# Patient Record
Sex: Female | Born: 1960 | Marital: Married | Smoking: Former smoker
Health system: Southern US, Community
[De-identification: ages and names within clinical notes are randomized; demographics above are authoritative.]

## PROBLEM LIST (undated history)

## (undated) DIAGNOSIS — E119 Type 2 diabetes mellitus without complications: Secondary | ICD-10-CM

## (undated) DIAGNOSIS — N189 Chronic kidney disease, unspecified: Secondary | ICD-10-CM

## (undated) DIAGNOSIS — T4145XA Adverse effect of unspecified anesthetic, initial encounter: Secondary | ICD-10-CM

## (undated) DIAGNOSIS — F329 Major depressive disorder, single episode, unspecified: Secondary | ICD-10-CM

## (undated) DIAGNOSIS — N289 Disorder of kidney and ureter, unspecified: Secondary | ICD-10-CM

## (undated) DIAGNOSIS — I10 Essential (primary) hypertension: Secondary | ICD-10-CM

## (undated) DIAGNOSIS — G4730 Sleep apnea, unspecified: Secondary | ICD-10-CM

## (undated) DIAGNOSIS — K219 Gastro-esophageal reflux disease without esophagitis: Secondary | ICD-10-CM

## (undated) DIAGNOSIS — F419 Anxiety disorder, unspecified: Secondary | ICD-10-CM

## (undated) DIAGNOSIS — R0600 Dyspnea, unspecified: Secondary | ICD-10-CM

## (undated) DIAGNOSIS — T8859XA Other complications of anesthesia, initial encounter: Secondary | ICD-10-CM

## (undated) DIAGNOSIS — F32A Depression, unspecified: Secondary | ICD-10-CM

## (undated) HISTORY — PX: APPENDECTOMY: SHX54

## (undated) HISTORY — PX: FOOT SURGERY: SHX648

## (undated) HISTORY — PX: HYSTEROSCOPY WITH D & C: SHX1775

## (undated) HISTORY — PX: CHOLECYSTECTOMY: SHX55

## (undated) HISTORY — PX: HAND SURGERY: SHX662

## (undated) HISTORY — PX: LIVER SURGERY: SHX698

## (undated) HISTORY — PX: TONSILLECTOMY: SUR1361

## (undated) HISTORY — PX: CARDIAC SURGERY: SHX584

## (undated) HISTORY — PX: UPPER GI ENDOSCOPY: SHX6162

## (undated) HISTORY — PX: HYSTEROSCOPY W/D&C: SHX1775

---
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
## 1998-12-12 ENCOUNTER — Other Ambulatory Visit: Admission: RE | Admit: 1998-12-12 | Discharge: 1998-12-12 | Payer: Self-pay | Admitting: Podiatry

## 2004-08-06 ENCOUNTER — Ambulatory Visit: Payer: Self-pay

## 2004-08-14 ENCOUNTER — Ambulatory Visit: Payer: Self-pay | Admitting: Gerontology

## 2004-08-22 ENCOUNTER — Ambulatory Visit: Payer: Self-pay | Admitting: Surgery

## 2004-09-16 ENCOUNTER — Ambulatory Visit: Payer: Self-pay | Admitting: Surgery

## 2005-03-17 ENCOUNTER — Ambulatory Visit: Payer: Self-pay | Admitting: Internal Medicine

## 2006-06-02 ENCOUNTER — Ambulatory Visit: Payer: Self-pay | Admitting: Internal Medicine

## 2007-06-15 ENCOUNTER — Ambulatory Visit: Payer: Self-pay | Admitting: Internal Medicine

## 2008-06-15 ENCOUNTER — Ambulatory Visit: Payer: Self-pay | Admitting: Internal Medicine

## 2009-10-17 ENCOUNTER — Ambulatory Visit: Payer: Self-pay | Admitting: Internal Medicine

## 2010-12-04 ENCOUNTER — Ambulatory Visit: Payer: Self-pay | Admitting: Internal Medicine

## 2012-02-11 ENCOUNTER — Ambulatory Visit: Payer: Self-pay | Admitting: Internal Medicine

## 2013-04-22 ENCOUNTER — Ambulatory Visit: Payer: Self-pay

## 2014-06-07 DIAGNOSIS — G4733 Obstructive sleep apnea (adult) (pediatric): Secondary | ICD-10-CM | POA: Insufficient documentation

## 2014-06-07 DIAGNOSIS — I10 Essential (primary) hypertension: Secondary | ICD-10-CM | POA: Insufficient documentation

## 2014-06-07 DIAGNOSIS — F17200 Nicotine dependence, unspecified, uncomplicated: Secondary | ICD-10-CM | POA: Insufficient documentation

## 2014-06-07 DIAGNOSIS — F329 Major depressive disorder, single episode, unspecified: Secondary | ICD-10-CM | POA: Insufficient documentation

## 2014-06-07 DIAGNOSIS — F32A Depression, unspecified: Secondary | ICD-10-CM | POA: Insufficient documentation

## 2014-06-12 ENCOUNTER — Ambulatory Visit: Payer: Self-pay

## 2014-06-29 ENCOUNTER — Ambulatory Visit: Payer: Self-pay | Admitting: Internal Medicine

## 2014-11-27 ENCOUNTER — Ambulatory Visit: Payer: Self-pay | Admitting: Gastroenterology

## 2015-02-26 LAB — SURGICAL PATHOLOGY

## 2015-04-26 DIAGNOSIS — K219 Gastro-esophageal reflux disease without esophagitis: Secondary | ICD-10-CM | POA: Insufficient documentation

## 2015-04-26 DIAGNOSIS — F418 Other specified anxiety disorders: Secondary | ICD-10-CM | POA: Insufficient documentation

## 2015-07-19 ENCOUNTER — Ambulatory Visit
Admission: RE | Admit: 2015-07-19 | Discharge: 2015-07-19 | Disposition: A | Discharge status: Home / Self Care | Payer: Managed Care, Other (non HMO) | Source: Ambulatory Visit | Attending: Internal Medicine | Admitting: Internal Medicine

## 2015-07-19 ENCOUNTER — Other Ambulatory Visit: Payer: Self-pay | Admitting: Internal Medicine

## 2015-07-19 DIAGNOSIS — M7989 Other specified soft tissue disorders: Secondary | ICD-10-CM | POA: Diagnosis not present

## 2015-07-19 DIAGNOSIS — M79605 Pain in left leg: Secondary | ICD-10-CM

## 2015-10-22 NOTE — H&P (Signed)
GYN PRE-OP NOTE  CC: PMB, endometrial polyp on biopsy  LMP: 5 years ago  Subjective:    Alyssa Maynard is a 54 y.o. female who presents for a retun office visit. She was found to have an endometrial polyp on biopsy after evaluation for post-menopausal bleeding.  Denies dysparunia. Abnl discharge, lesions.   No other complaints.   Gynecologic History No LMP recorded. Patient is postmenopausal. Sexually active: yes Desires STD screening: no  Last Pap: 2013. Results were: normal Last mammogram: 2015. Results were: normal  Obstetric History            OB History  Gravida Para Term Preterm AB SAB TAB Ectopic Multiple Living  0 0 0 0 0 0 0 0 0 0         Past Medical History:  has a past medical history of Acid reflux; Depression; Frequent episodes of bronchitis; Ganglion cyst of wrist; Hyperplastic colon polyp (11/27/2014); Liver tumor (benign); and Tubular adenoma of colon (11/27/2014). Problem List: has HTN (hypertension); OSA (obstructive sleep apnea); GERD (gastroesophageal reflux disease); Tobacco dependence; Depression; Cough; Essential hypertension; Hyperglycemia; Hyperlipidemia; Mixed anxiety and depressive disorder; Gastroesophageal reflux disease without esophagitis; Type 2 diabetes mellitus without complication, without long-term current use of insulin; Pure hypercholesterolemia; and PMB (postmenopausal bleeding) on her problem list. Past Surgical History:  has a past surgical history that includes Tonsillectomy; Appendectomy; Cholecystectomy (09/2004); BIOPSY OF LIVER NODULE (08/2004); Colonoscopy (03/2004); BUNION REMOVAL; and Colonoscopy (11/27/2014). Family History: family history includes Cancer in her father; Clotting disorder in her father; Heart attack in her father, mother, and other; Stroke in her mother. Social History:  reports that she has been smoking Cigarettes. She has been smoking about 0.50 packs per day. She has never used smokeless tobacco. She  reports that she drinks alcohol. She reports that she does not use illicit drugs. Current Medications: has a current medication list which includes the following prescription(s): amlodipine, aspirin, blood glucose diagnostic, blood glucose meter, escitalopram oxalate, esomeprazole, lancets, lisinopril-hydrochlorothiazide, metformin, naproxen, proair hfa, and tramadol. Prior to encounter Medications:        Current Outpatient Prescriptions on File Prior to Visit  Medication Sig Dispense Refill  . amLODIPine (NORVASC) 10 MG tablet Take 10 mg by mouth once daily.  11  . aspirin 81 MG EC tablet Take 81 mg by mouth once daily.    . blood glucose diagnostic (ONETOUCH ULTRA TEST) test strip Use once daily. 100 each 3  . blood glucose meter kit Use as directed. 1 each 0  . escitalopram oxalate (LEXAPRO) 10 MG tablet take 1 tablet by mouth once daily 30 tablet 2  . esomeprazole (NEXIUM) 40 MG DR capsule take 1 capsule by mouth once daily 90 capsule 3  . lancets (ONETOUCH DELICA LANCETS) 33 gauge Misc Use 1 Device once daily. 100 each 3  . lisinopril-hydrochlorothiazide (PRINZIDE,ZESTORETIC) 20-12.5 mg tablet take 2 tablets by mouth once daily 180 tablet 1  . metFORMIN (GLUCOPHAGE) 850 MG tablet Take 1 tablet (850 mg total) by mouth 2 (two) times daily with meals. 180 tablet 3  . naproxen (NAPROSYN) 500 MG tablet Take 500 mg by mouth 2 (two) times daily.    [name] PROAIR HFA 90 mcg/actuation inhaler once daily as needed.  0  . traMADol (ULTRAM) 50 mg tablet Take 50-100 mg by mouth every 8 (eight) hours as needed.     No current facility-administered medications on file prior to visit.    Allergies: is allergic to bandages/flexible/assorted [adhesive bandage] and latex.  Review of Systems 14 systems reviewed pertinent positives and negatives as noted in the HPI and below.   Objective:       Vitals:   10/12/15 0954  BP: 145/74  Pulse: 94   General appearance: alert, appears stated age  and cooperative Head: Normocephalic, without obvious abnormality, atraumatic Eyes: conjunctivae/corneas clear. PERRL, EOM's intact. Fundi benign. Sclera anicteric.  PULM: CTAB CV: RRR  From previous visit: Abdomen: soft, non-tender; bowel sounds normal; no masses, no organomegaly Pelvic: cervix normal in appearance, external genitalia normal, no adnexal masses or tenderness, no bladder tenderness, no cervical motion tenderness, rectovaginal septum normal, urethra without abnormality or discharge, uterus normal size, shape, and consistency and vagina normal without discharge Extremities: extremities normal, atraumatic, no cyanosis or edema Lymph nodes: Inguinal adenopathy: none    EMBx:  Benign endometrial polyp  TVUS: Ut wnl Endometrium= 5.61 mm Abn postmenopause bil ov simple cysts 1 rt simple=1.17 cm 2 rt simple paraovarian=1.29 cm Lt simple=1.17 cm   Assessment:    54yo G0 w/ PMB, endometrial polyp on biopsy. Plan for OR for D&C hysteroscopy, polypectomy.   Plan:     1. PMB, endometrial polyp - R/B/A of D&C hysteroscopy/polypectomy reviewed with patient including risks of bleeding/pain/infection; risks associated with anesthesia also reviewed; procedure described in detail - All questions answered, consent signed  2. HCM - mammogram needs to be scheduled at next visit  3. Simple ovarian cysts noted on exam - will f/u in 6 months given simple appearance, lack of symptoms, and size  Joylene Igo, MD

## 2015-10-23 ENCOUNTER — Encounter
Admission: RE | Admit: 2015-10-23 | Discharge: 2015-10-23 | Disposition: A | Discharge status: Home / Self Care | Payer: Managed Care, Other (non HMO) | Source: Ambulatory Visit | Attending: Obstetrics and Gynecology | Admitting: Obstetrics and Gynecology

## 2015-10-23 DIAGNOSIS — Z01812 Encounter for preprocedural laboratory examination: Secondary | ICD-10-CM | POA: Insufficient documentation

## 2015-10-23 DIAGNOSIS — Z01810 Encounter for preprocedural cardiovascular examination: Secondary | ICD-10-CM | POA: Diagnosis not present

## 2015-10-23 HISTORY — DX: Type 2 diabetes mellitus without complications: E11.9

## 2015-10-23 HISTORY — DX: Anxiety disorder, unspecified: F41.9

## 2015-10-23 HISTORY — DX: Gastro-esophageal reflux disease without esophagitis: K21.9

## 2015-10-23 HISTORY — DX: Essential (primary) hypertension: I10

## 2015-10-23 HISTORY — DX: Depression, unspecified: F32.A

## 2015-10-23 HISTORY — DX: Major depressive disorder, single episode, unspecified: F32.9

## 2015-10-23 LAB — TYPE AND SCREEN
ABO/RH(D): A POS
Antibody Screen: NEGATIVE

## 2015-10-23 LAB — BASIC METABOLIC PANEL WITH GFR
Anion gap: 12 (ref 5–15)
BUN: 19 mg/dL (ref 6–20)
CO2: 20 mmol/L — ABNORMAL LOW (ref 22–32)
Calcium: 9.6 mg/dL (ref 8.9–10.3)
Chloride: 106 mmol/L (ref 101–111)
Creatinine, Ser: 1.12 mg/dL — ABNORMAL HIGH (ref 0.44–1.00)
GFR calc Af Amer: >60 mL/min (ref >60)
GFR calc non Af Amer: 54 mL/min — ABNORMAL LOW (ref >60)
Glucose, Bld: 217 mg/dL — ABNORMAL HIGH (ref 65–99)
Potassium: 3.6 mmol/L (ref 3.5–5.1)
Sodium: 138 mmol/L (ref 135–145)

## 2015-10-23 NOTE — Pre-Procedure Instructions (Signed)
Met B results sent to Dr. Halfon and anesthesia for review. 

## 2015-10-23 NOTE — Patient Instructions (Addendum)
  Your procedure is scheduled on: Friday 11/02/2015 Report to Day Surgery. 2ND FLOOR MEDICAL MALL ENTRANCE To find out your arrival time please call [phone] between 1PM - 3PM on Thursday 11/01/2015.  Remember: Instructions that are not followed completely may result in serious medical risk, up to and including death, or upon the discretion of your surgeon and anesthesiologist your surgery may need to be rescheduled.    __X__ 1. Do not eat food or drink liquids after midnight. No gum chewing or hard candies.     __X__ 2. No Alcohol for 24 hours before or after surgery.   ____ 3. Bring all medications with you on the day of surgery if instructed.    __X__ 4. Notify your doctor if there is any change in your medical condition     (cold, fever, infections).     Do not wear jewelry, make-up, hairpins, clips or nail polish.  Do not wear lotions, powders, or perfumes. You may wear deodorant.  Do not shave 48 hours prior to surgery. Men may shave face and neck.  Do not bring valuables to the hospital.    [facility] is not responsible for any belongings or valuables.               Contacts, dentures or bridgework may not be worn into surgery.  Leave your suitcase in the car. After surgery it may be brought to your room.  For patients admitted to the hospital, discharge time is determined by your                treatment team.   Patients discharged the day of surgery will not be allowed to drive home.   Please read over the following fact sheets that you were given:   Surgical Site Infection Prevention   __X__ Take these medicines the morning of surgery with A SIP OF WATER:    1. LEXAPRO  2. NEXIUM  3.   4.  5.  6.  ____ Fleet Enema (as directed)   ____ Use CHG Soap as directed  ____ Use inhalers on the day of surgery  __X__ Stop metformin 2 days prior to surgery    ____ Take 1/2 of usual insulin dose the night before surgery and none on the morning of surgery.   __X__  Stop Coumadin/Plavix/aspirin on FRIDAY 10/26/2015  ____ Stop Anti-inflammatories on    ____ Stop supplements until after surgery.    __X__ Bring C-Pap to the hospital.

## 2015-10-23 NOTE — Pre-Procedure Instructions (Addendum)
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

## 2015-10-24 LAB — ABO/RH: ABO/RH(D): A POS

## 2015-11-02 ENCOUNTER — Encounter
Admission: RE | Disposition: A | Discharge status: Home / Self Care | Payer: Self-pay | Source: Ambulatory Visit | Attending: Obstetrics and Gynecology

## 2015-11-02 ENCOUNTER — Ambulatory Visit: Payer: Managed Care, Other (non HMO) | Admitting: *Deleted

## 2015-11-02 ENCOUNTER — Ambulatory Visit
Admission: RE | Admit: 2015-11-02 | Discharge: 2015-11-02 | Disposition: A | Discharge status: Home / Self Care | Payer: Managed Care, Other (non HMO) | Source: Ambulatory Visit | Attending: Obstetrics and Gynecology | Admitting: Obstetrics and Gynecology

## 2015-11-02 DIAGNOSIS — E7800 Pure hypercholesterolemia, unspecified: Secondary | ICD-10-CM | POA: Diagnosis not present

## 2015-11-02 DIAGNOSIS — Z79899 Other long term (current) drug therapy: Secondary | ICD-10-CM | POA: Insufficient documentation

## 2015-11-02 DIAGNOSIS — E119 Type 2 diabetes mellitus without complications: Secondary | ICD-10-CM | POA: Insufficient documentation

## 2015-11-02 DIAGNOSIS — K219 Gastro-esophageal reflux disease without esophagitis: Secondary | ICD-10-CM | POA: Insufficient documentation

## 2015-11-02 DIAGNOSIS — F17210 Nicotine dependence, cigarettes, uncomplicated: Secondary | ICD-10-CM | POA: Diagnosis not present

## 2015-11-02 DIAGNOSIS — R05 Cough: Secondary | ICD-10-CM | POA: Diagnosis not present

## 2015-11-02 DIAGNOSIS — F329 Major depressive disorder, single episode, unspecified: Secondary | ICD-10-CM | POA: Diagnosis not present

## 2015-11-02 DIAGNOSIS — N840 Polyp of corpus uteri: Secondary | ICD-10-CM | POA: Diagnosis present

## 2015-11-02 DIAGNOSIS — Z6841 Body Mass Index (BMI) 40.0 and over, adult: Secondary | ICD-10-CM | POA: Diagnosis not present

## 2015-11-02 DIAGNOSIS — E669 Obesity, unspecified: Secondary | ICD-10-CM | POA: Diagnosis not present

## 2015-11-02 DIAGNOSIS — N950 Postmenopausal bleeding: Secondary | ICD-10-CM | POA: Diagnosis not present

## 2015-11-02 DIAGNOSIS — N83209 Unspecified ovarian cyst, unspecified side: Secondary | ICD-10-CM | POA: Diagnosis not present

## 2015-11-02 DIAGNOSIS — I10 Essential (primary) hypertension: Secondary | ICD-10-CM | POA: Diagnosis not present

## 2015-11-02 DIAGNOSIS — Z7984 Long term (current) use of oral hypoglycemic drugs: Secondary | ICD-10-CM | POA: Diagnosis not present

## 2015-11-02 DIAGNOSIS — Z86010 Personal history of colonic polyps: Secondary | ICD-10-CM | POA: Insufficient documentation

## 2015-11-02 DIAGNOSIS — G4733 Obstructive sleep apnea (adult) (pediatric): Secondary | ICD-10-CM | POA: Diagnosis not present

## 2015-11-02 HISTORY — PX: HYSTEROSCOPY WITH D & C: SHX1775

## 2015-11-02 HISTORY — PX: HYSTEROSCOPY W/D&C: SHX1775

## 2015-11-02 LAB — GLUCOSE, CAPILLARY
Glucose-Capillary: 157 mg/dL — ABNORMAL HIGH (ref 65–99)
Glucose-Capillary: 149 mg/dL — ABNORMAL HIGH (ref 65–99)

## 2015-11-02 SURGERY — DILATATION AND CURETTAGE /HYSTEROSCOPY
Anesthesia: General

## 2015-11-02 MED ORDER — HYDROMORPHONE HCL 1 MG/ML IJ SOLN
0.2500 mg | INTRAMUSCULAR | Status: DC | PRN
  Administered 2015-11-02 (×4): 0.5 mg via INTRAVENOUS

## 2015-11-02 MED ORDER — LIDOCAINE HCL (CARDIAC) 20 MG/ML IV SOLN
INTRAVENOUS | Status: DC | PRN
  Administered 2015-11-02: 100 mg via INTRAVENOUS

## 2015-11-02 MED ORDER — ONDANSETRON HCL 4 MG/2ML IJ SOLN
INTRAMUSCULAR | Status: DC | PRN
Start: 2015-11-02 — End: 2015-11-02
  Administered 2015-11-02: 4 mg via INTRAVENOUS

## 2015-11-02 MED ORDER — SODIUM CHLORIDE 0.9 % IV SOLN
INTRAVENOUS | Status: DC
  Administered 2015-11-02: 09:00:00 via INTRAVENOUS

## 2015-11-02 MED ORDER — MIDAZOLAM HCL 2 MG/2ML IJ SOLN
INTRAMUSCULAR | Status: DC | PRN
  Administered 2015-11-02: 2 mg via INTRAVENOUS

## 2015-11-02 MED ORDER — HYDROMORPHONE HCL 1 MG/ML IJ SOLN
INTRAMUSCULAR | Status: AC
  Administered 2015-11-02: 0.5 mg via INTRAVENOUS
  Filled 2015-11-02: qty 1

## 2015-11-02 MED ORDER — KETOROLAC TROMETHAMINE 15 MG/ML IJ SOLN
15.0000 mg | Freq: Four times a day (QID) | INTRAMUSCULAR | Status: AC
  Administered 2015-11-02: 15 mg via INTRAVENOUS

## 2015-11-02 MED ORDER — FENTANYL CITRATE (PF) 100 MCG/2ML IJ SOLN
INTRAMUSCULAR | Status: DC | PRN
  Administered 2015-11-02: 100 ug via INTRAVENOUS
  Administered 2015-11-02 (×2): 50 ug via INTRAVENOUS

## 2015-11-02 MED ORDER — LACTATED RINGERS IV SOLN
INTRAVENOUS | Status: DC | PRN
  Administered 2015-11-02: 09:00:00 via INTRAVENOUS

## 2015-11-02 MED ORDER — KETOROLAC TROMETHAMINE 30 MG/ML IJ SOLN
INTRAMUSCULAR | Status: AC
  Filled 2015-11-02: qty 1

## 2015-11-02 MED ORDER — KETOROLAC TROMETHAMINE 15 MG/ML IJ SOLN: 15.0000 mg | Freq: Four times a day (QID) | INTRAMUSCULAR | Status: DC | PRN

## 2015-11-02 MED ORDER — ONDANSETRON HCL 4 MG/2ML IJ SOLN: 4.0000 mg | Freq: Once | INTRAMUSCULAR | Status: DC | PRN

## 2015-11-02 MED ORDER — PROPOFOL 10 MG/ML IV BOLUS
INTRAVENOUS | Status: DC | PRN
Start: 2015-11-02 — End: 2015-11-02
  Administered 2015-11-02: 200 mg via INTRAVENOUS

## 2015-11-02 SURGICAL SUPPLY — 16 items
CANISTER SUCT 3000ML (MISCELLANEOUS) ×2 IMPLANT
CATH ROBINSON RED A/P 16FR (CATHETERS) ×2 IMPLANT
ELECT RESECT POWERBALL 24F (MISCELLANEOUS) ×1 IMPLANT
GLOVE BIO SURGEON STRL SZ7 (GLOVE) ×2 IMPLANT
GLOVE BIOGEL PI IND STRL 7.5 (GLOVE) ×1 IMPLANT
GLOVE BIOGEL PI INDICATOR 7.5 (GLOVE) ×1
GOWN STRL REUS W/ TWL LRG LVL3 (GOWN DISPOSABLE) ×2 IMPLANT
GOWN STRL REUS W/TWL LRG LVL3 (GOWN DISPOSABLE) ×4
IV LACTATED RINGERS 1000ML (IV SOLUTION) ×2 IMPLANT
KIT RM TURNOVER CYSTO AR (KITS) ×2 IMPLANT
LOOP CUT RT ANGL 28F (MISCELLANEOUS) ×2 IMPLANT
PACK DNC HYST (MISCELLANEOUS) ×2 IMPLANT
PAD GROUND ADULT SPLIT (MISCELLANEOUS) ×2 IMPLANT
PAD OB MATERNITY 4.3X12.25 (PERSONAL CARE ITEMS) ×2 IMPLANT
PAD PREP 24X41 OB/GYN DISP (PERSONAL CARE ITEMS) ×2 IMPLANT
TUBING CONNECTING 10 (TUBING) ×2 IMPLANT

## 2015-11-02 NOTE — Anesthesia Preprocedure Evaluation (Signed)
Anesthesia Evaluation  Patient identified by MRN, date of birth, ID band Patient awake    Reviewed: Allergy & Precautions, NPO status   Airway Mallampati: I  TM Distance: >3 FB Neck ROM: Full    Dental  (+) Teeth Intact   Pulmonary sleep apnea and Continuous Positive Airway Pressure Ventilation , Current Smoker,  1/2 pk/d smoker. Has a cold--chest is clear.   Pulmonary exam normal breath sounds clear to auscultation       Cardiovascular Exercise Tolerance: Good hypertension, Pt. on medications Normal cardiovascular exam Rhythm:Regular Rate:Normal     Neuro/Psych    GI/Hepatic GERD  Medicated and Controlled,  Endo/Other  diabetes, Type obesityBG 157 this morning.  Renal/GU      Musculoskeletal   Abdominal (+) + obese,  Abdomen: soft.    Peds  Hematology   Anesthesia Other Findings   Reproductive/Obstetrics                             Anesthesia Physical Anesthesia Plan  ASA: III  Anesthesia Plan: General   Post-op Pain Management:    Induction: Intravenous  Airway Management Planned: LMA  Additional Equipment:   Intra-op Plan:   Post-operative Plan: Extubation in OR  Informed Consent: I have reviewed the patients History and Physical, chart, labs and discussed the procedure including the risks, benefits and alternatives for the proposed anesthesia with the patient or authorized representative who has indicated his/her understanding and acceptance.     Plan Discussed with: CRNA  Anesthesia Plan Comments:         Anesthesia Quick Evaluation

## 2015-11-02 NOTE — Interval H&P Note (Signed)
History and Physical Interval Note:  11/02/2015 8:26 AM  Alyssa Maynard  has presented today for surgery, with the diagnosis of POST MENOPAUSAL BLEEDING  The various methods of treatment have been discussed with the patient and family. After consideration of risks, benefits and other options for treatment, the patient has consented to  Procedure(s): DILATATION AND CURETTAGE /HYSTEROSCOPY (N/A) as a surgical intervention .  The patient's history has been reviewed, patient examined, no change in status, stable for surgery.  I have reviewed the patient's chart and labs.  Questions were answered to the patient's satisfaction.     Leonette MostJohanna K Andilynn Delavega

## 2015-11-02 NOTE — Discharge Instructions (Signed)
Dilation and Curettage, Care After Refer to this sheet in the next few weeks. These instructions provide you with information on caring for yourself after your procedure. Your health care provider may also give you more specific instructions. Your treatment has been planned according to current medical practices, but problems sometimes occur. Call your health care provider if you have any problems or questions after your procedure. WHAT TO EXPECT AFTER THE PROCEDURE After your procedure, it is typical to have light cramping and bleeding. This may last for 2 days to 2 weeks after the procedure. HOME CARE INSTRUCTIONS   Do not drive for 24 hours.  Wait 1 week before returning to strenuous activities.  You may resume your usual diet.  Drink enough fluids to keep your urine clear or pale yellow.  Your usual bowel function should return. If you have constipation, you may:  Take a mild laxative with permission from your health care provider.  Add fruit and bran to your diet.  Drink more fluids.  Take showers instead of baths for 2 WEEKS  Do not go swimming or use a hot tub for 2 WEEKS.  Try to have someone with you or available to you the first 24-48 hours, especially if you were given a general anesthetic.  Do not douche, use tampons, or have sex (intercourse) for 2 weeks after the procedure.  Only take over-the-counter or prescription medicines as directed by your health care provider. Do not take aspirin. It can cause bleeding. Take ibuprofen 800mg  every 8 hours as needed for pain.   Follow up with your health care provider as directed. SEEK MEDICAL CARE IF:   You have increasing cramps or pain that is not relieved with medicine.  You have abdominal pain that does not seem to be related to the same area of earlier cramping and pain.  You have bad smelling vaginal discharge.  You have a rash.  You are having problems with any medicine. SEEK IMMEDIATE MEDICAL CARE IF:   You  have bleeding that is heavier than a normal menstrual period.  You have a fever.  You have chest pain.  You have shortness of breath.  You feel dizzy or feel like fainting.  You pass out.  You have pain in your shoulder strap area.  You have heavy vaginal bleeding with or without blood clots. MAKE SURE YOU:   Understand these instructions.  Will watch your condition.  Will get help right away if you are not doing well or get worse.   This information is not intended to replace advice given to you by your health care provider. Make sure you discuss any questions you have with your health care provider.   Document Released: 10/17/2000 Document Revised: 10/25/2013 Document Reviewed: 05/19/2013 Elsevier Interactive Patient Education [employer]2016 Elsevier Inc.

## 2015-11-02 NOTE — Transfer of Care (Signed)
Immediate Anesthesia Transfer of Care Note  Patient: Alyssa Maynard  Procedure(s) Performed: Procedure(s): DILATATION AND CURETTAGE /HYSTEROSCOPY (N/A)  Patient Location: PACU  Anesthesia Type:General  Level of Consciousness: awake  Airway & Oxygen Therapy: Patient Spontanous Breathing  Post-op Assessment: Report given to RN  Post vital signs: stable  Last Vitals:  Filed Vitals:   11/02/15 0810 11/02/15 0930  BP: 129/85 134/80  Pulse: 82 83  Temp: 36.2 C 36.6 C  Resp: 16 12    Complications: No apparent anesthesia complications

## 2015-11-02 NOTE — Anesthesia Postprocedure Evaluation (Signed)
Anesthesia Post Note  Patient: Alyssa Maynard  Procedure(s) Performed: Procedure(s) (LRB): DILATATION AND CURETTAGE /HYSTEROSCOPY (N/A)  Patient location during evaluation: PACU Anesthesia Type: General Level of consciousness: awake and alert Pain management: pain level controlled Vital Signs Assessment: post-procedure vital signs reviewed and stable Respiratory status: spontaneous breathing Cardiovascular status: blood pressure returned to baseline Postop Assessment: no headache Anesthetic complications: no    Last Vitals:  Filed Vitals:   11/02/15 0930 11/02/15 0945  BP: 134/80 140/94  Pulse: 83 70  Temp: 36.6 C   Resp: 12 20    Last Pain:  Filed Vitals:   11/02/15 0947  PainSc: 6                  Kalyan Barabas M

## 2015-11-02 NOTE — Anesthesia Procedure Notes (Signed)
Procedure Name: LMA Insertion Date/Time: 11/02/2015 9:02 AM Performed by: Pearla DubonnetHUANG, Yareth Macdonnell Pre-anesthesia Checklist: Patient identified, Emergency Drugs available, Suction available, Patient being monitored and Timeout performed Patient Re-evaluated:Patient Re-evaluated prior to inductionOxygen Delivery Method: Circle system utilized Preoxygenation: Pre-oxygenation with 100% oxygen Intubation Type: IV induction Ventilation: Mask ventilation without difficulty LMA Size: 4.5 Number of attempts: 1 Dental Injury: Teeth and Oropharynx as per pre-operative assessment

## 2015-11-02 NOTE — Progress Notes (Signed)
Discussed when to resume aspirin with Dr Tildon HuskyHalfon via tele - told pt may resume 1 week as per Dr Tildon HuskyHalfon via tele

## 2015-11-02 NOTE — Op Note (Signed)
Operative Report Hysteroscopy with Dilation and Curettage   Indications: Endometrial polyp, Postmenopausal bleeding   Pre-operative Diagnosis: endometrial polyp   Post-operative Diagnosis: same.  Procedure: 1. Exam under anesthesia 2. D&C 3. Hysteroscopy 4. Polypectomy  Surgeon: Burnett CorrenteJohanna Deloros Beretta, MD  Assistant(s):  None  Anesthesia: General endotracheal anesthesia  Estimated Blood Loss:  Minimal         Intraoperative medications: none         Total IV Fluids: 300ml  Urine Output: 30ml         Specimens: endometrial curettings. polyp         Complications:  None; patient tolerated the procedure well.         Disposition: PACU - hemodynamically stable.         Condition: stable  Findings: Uterus measuring 6 cm by sound; normal cervix, vagina, perineum.  Loose polypoid tissue near left ostium  Indication for procedure/Consents: 54 y.o. G0  here for scheduled surgery for the aforementioned diagnoses.   Risks of surgery were discussed with the patient including but not limited to: bleeding which may require transfusion; infection which may require antibiotics; injury to uterus or surrounding organs; intrauterine scarring which may impair future fertility; need for additional procedures including laparotomy or laparoscopy; and other postoperative/anesthesia complications. Written informed consent was obtained.    Procedure Details:   The patient was taken to the operating room where general anesthesia was administered and was found to be adequate. After a formal and adequate timeout was performed, she was placed in the dorsal lithotomy position and examined with the above findings. She was then prepped and draped in the sterile manner. Her bladder was catheterized for an estimated amount of clear, yellow urine. A weighed speculum was then placed in the patient's vagina and a single tooth tenaculum was applied to the anterior lip of the cervix.  Her cervix was serially  dilated to 15 [place] using Hanks dilators.The hysteroscope was introduced to reveal the above findings. A sharp curettage was then performed until there was a gritty texture in all four quadrants. The tenaculum was removed from the anterior lip of the cervix and the vaginal speculum was removed. Good hemostasis was noted. The patient tolerated the procedure well and was taken to the recovery area awake and in stable condition. The patient will be discharged to home as per PACU criteria. Routine postoperative instructions given. She will follow up in the clinic in two weeks for postoperative evaluation.  Ala DachJohanna K Izick Gasbarro, MD

## 2015-11-06 LAB — SURGICAL PATHOLOGY

## 2016-08-12 DIAGNOSIS — N183 Chronic kidney disease, stage 3 unspecified: Secondary | ICD-10-CM | POA: Insufficient documentation

## 2016-08-13 ENCOUNTER — Other Ambulatory Visit: Payer: Self-pay | Admitting: Internal Medicine

## 2016-08-13 DIAGNOSIS — N183 Chronic kidney disease, stage 3 unspecified: Secondary | ICD-10-CM

## 2016-09-12 ENCOUNTER — Ambulatory Visit
Admission: RE | Admit: 2016-09-12 | Discharge: 2016-09-12 | Disposition: A | Discharge status: Home / Self Care | Payer: Managed Care, Other (non HMO) | Source: Ambulatory Visit | Attending: Internal Medicine | Admitting: Internal Medicine

## 2016-09-12 DIAGNOSIS — N183 Chronic kidney disease, stage 3 unspecified: Secondary | ICD-10-CM

## 2017-02-17 DIAGNOSIS — R9431 Abnormal electrocardiogram [ECG] [EKG]: Secondary | ICD-10-CM | POA: Insufficient documentation

## 2017-03-04 ENCOUNTER — Other Ambulatory Visit: Payer: Self-pay | Admitting: Otolaryngology

## 2017-03-04 DIAGNOSIS — R42 Dizziness and giddiness: Secondary | ICD-10-CM

## 2017-03-17 ENCOUNTER — Ambulatory Visit
Admission: RE | Admit: 2017-03-17 | Discharge: 2017-03-17 | Disposition: A | Discharge status: Home / Self Care | Payer: 59 | Source: Ambulatory Visit | Attending: Otolaryngology | Admitting: Otolaryngology

## 2017-03-17 DIAGNOSIS — I6509 Occlusion and stenosis of unspecified vertebral artery: Secondary | ICD-10-CM | POA: Insufficient documentation

## 2017-03-17 DIAGNOSIS — R42 Dizziness and giddiness: Secondary | ICD-10-CM

## 2017-03-17 DIAGNOSIS — I6782 Cerebral ischemia: Secondary | ICD-10-CM | POA: Insufficient documentation

## 2017-03-17 MED ORDER — GADOBENATE DIMEGLUMINE 529 MG/ML IV SOLN
20.0000 mL | Freq: Once | INTRAVENOUS | Status: AC | PRN
  Administered 2017-03-17: 20 mL via INTRAVENOUS

## 2017-04-03 DIAGNOSIS — N189 Chronic kidney disease, unspecified: Secondary | ICD-10-CM

## 2017-04-03 HISTORY — DX: Chronic kidney disease, unspecified: N18.9

## 2017-04-07 ENCOUNTER — Encounter (INDEPENDENT_AMBULATORY_CARE_PROVIDER_SITE_OTHER): Payer: Self-pay | Admitting: Vascular Surgery

## 2017-04-07 ENCOUNTER — Ambulatory Visit (INDEPENDENT_AMBULATORY_CARE_PROVIDER_SITE_OTHER): Payer: 59 | Admitting: Vascular Surgery

## 2017-04-07 VITALS — BP 128/83 | HR 95 | Resp 17 | Ht 65.0 in | Wt 255.8 lb

## 2017-04-07 DIAGNOSIS — R42 Dizziness and giddiness: Secondary | ICD-10-CM

## 2017-04-07 DIAGNOSIS — E119 Type 2 diabetes mellitus without complications: Secondary | ICD-10-CM

## 2017-04-07 DIAGNOSIS — I10 Essential (primary) hypertension: Secondary | ICD-10-CM | POA: Diagnosis not present

## 2017-04-07 DIAGNOSIS — N183 Chronic kidney disease, stage 3 unspecified: Secondary | ICD-10-CM | POA: Insufficient documentation

## 2017-04-07 DIAGNOSIS — E1122 Type 2 diabetes mellitus with diabetic chronic kidney disease: Secondary | ICD-10-CM | POA: Insufficient documentation

## 2017-04-07 NOTE — Assessment & Plan Note (Signed)
blood pressure control important in reducing the progression of atherosclerotic disease. On appropriate oral medications.  

## 2017-04-07 NOTE — Patient Instructions (Signed)
Carotid Artery Disease The carotid arteries are arteries on both sides of the neck. They carry blood to the brain. Carotid artery disease is when the arteries get smaller (narrow) or get blocked. If these arteries get smaller or get blocked, you are more likely to have a stroke or warning stroke (transient ischemic attack). Follow these instructions at home:  Take medicines as told by your doctor. Make sure you understand all your medicine instructions. Do not stop your medicines without talking to your doctor first.  Follow your doctor's diet instructions. It is important to eat a healthy diet that includes plenty of: ? Fresh fruits. ? Vegetables. ? Lean meats.  Avoid: ? High-fat foods. ? High-sodium foods. ? Foods that are fried, overly processed, or have poor nutritional value.  Stay a healthy weight.  Stay active. Get at least 30 minutes of activity every day.  Do not smoke.  Limit alcohol use to: ? No more than 2 drinks a day for men. ? No more than 1 drink a day for women who are not pregnant.  Do not use illegal drugs.  Keep all doctor visits as told. Get help right away if:  You have sudden weakness or loss of feeling (numbness) on one side of the body, such as the face, arm, or leg.  You have sudden confusion.  You have trouble speaking (aphasia) or understanding.  You have sudden trouble seeing out of one or both eyes.  You have sudden trouble walking.  You have dizziness or feel like you might pass out (faint).  You have a loss of balance or your movements are not steady (uncoordinated).  You have a sudden, severe headache with no known cause.  You have trouble swallowing (dysphagia). Call your local emergency services (911 in [place] Do notdrive yourself to the clinic or hospital. This information is not intended to replace advice given to you by your health care provider. Make sure you discuss any questions you have with your health care  provider. Document Released: 10/06/2012 Document Revised: 03/27/2016 Document Reviewed: 04/20/2013 Elsevier Interactive Patient Education  2018 Elsevier Inc.  

## 2017-04-07 NOTE — Assessment & Plan Note (Signed)
blood glucose control important in reducing the progression of atherosclerotic disease. Also, involved in wound healing. On appropriate medications.  

## 2017-04-07 NOTE — Assessment & Plan Note (Signed)
Although extracranial cerebrovascular disease is not a particularly common cause of dizziness, given the recent MRI findings of think it is reasonable to evaluate at least initially with a carotid duplex which will also give us information about the vertebral arteries. This will be done at her convenience. I discussed the pathophysiology and natural history of carotid artery disease with the patient as well as vertebral artery disease with the patient. They voiced their understanding. She should continue her current medical regimen and should  consider taking an 81 mg aspirin dailyas well.

## 2017-04-07 NOTE — Progress Notes (Signed)
Patient ID: Alyssa Maynard, female   DOB: [birth year], 58 y.o.   MRN: [id number]  Chief Complaint  Patient presents with  . New Patient (Initial Visit)    HPI Alyssa Maynard is a 56 y.o. female.  I am asked to see the patient by Dr. Andee Poles for evaluation of vertebral artery stenosis.  The patient reports Dizzy episodes that are somewhat random but quite significant in their nature. These are associated with headaches and her stumbling to her right after they happen. These come on without a clear inciting event or causative factor. They involve random periods of onset not related to current activities. They're not related to head positioning. She has had what sounds like an extensive workup including a neurologist evaluation and an ENT evaluation. She has had an MRI which I have reviewed which showed no intracranial abnormalities. On this MRI, there was mention of sluggish flow in the left vertebral artery and they recommended an angiogram for further evaluation. With this finding, she is referred to [place] for evaluation.   Past Medical History:  Diagnosis Date  . Anxiety   . Depression   . Diabetes mellitus without complication (HCC)   . GERD (gastroesophageal reflux disease)   . Hypertension     Past Surgical History:  Procedure Laterality Date  . APPENDECTOMY    . CHOLECYSTECTOMY    . FOOT SURGERY    . HAND SURGERY    . HYSTEROSCOPY W/D&C N/A 11/02/2015   Procedure: DILATATION AND CURETTAGE /HYSTEROSCOPY;  Surgeon: Ala Dach, MD;  Location: [facility] ORS;  Service: Gynecology;  Laterality: N/A;  . LIVER SURGERY    . TONSILLECTOMY      Family History  Problem Relation Age of Onset  . Heart attack Mother   . Stroke Mother   . Heart attack Father   . Cancer Father   No bleeding or clotting disorders  Social History Social History  Substance Use Topics  . Smoking status: Current Every Day Smoker    Packs/day: 1.00  . Smokeless tobacco: Never Used  . Alcohol use 1.2 oz/week      2 Shots of liquor per week  No IVDU  Allergies  Allergen Reactions  . Latex Rash and Other (See Comments)    Burns skin   . Other Other (See Comments)    Other Reaction: SKIN IRRITATION    Current Outpatient Prescriptions  Medication Sig Dispense Refill  . escitalopram (LEXAPRO) 10 MG tablet Take 10 mg by mouth daily.    [name] esomeprazole (NEXIUM) 40 MG capsule Take 40 mg by mouth daily.    [name] glimepiride (AMARYL) 4 MG tablet   0  . lisinopril-hydrochlorothiazide (PRINZIDE,ZESTORETIC) 20-12.5 MG tablet Take 1 tablet by mouth 2 (two) times daily.    [name] omeprazole (PRILOSEC) 20 MG capsule Take 20 mg by mouth daily.    . sitaGLIPtin (JANUVIA) 50 MG tablet Take by mouth.    . metFORMIN (GLUCOPHAGE) 850 MG tablet Take 850 mg by mouth 2 (two) times daily with a meal.     No current facility-administered medications for this visit.       REVIEW OF SYSTEMS (Negative unless checked)  Constitutional: [] Weight loss  [] Fever  [] Chills Cardiac: [] Chest pain   [] Chest pressure   [] Palpitations   [] Shortness of breath when laying flat   [] Shortness of breath at rest   [] Shortness of breath with exertion. Vascular:  [] Pain in legs with walking   [] Pain in legs at rest   []   Pain in legs when laying flat   [] Claudication   [] Pain in feet when walking  [] Pain in feet at rest  [] Pain in feet when laying flat   [] History of DVT   [] Phlebitis   [] Swelling in legs   [] Varicose veins   [] Non-healing ulcers Pulmonary:   [] Uses home oxygen   [] Productive cough   [] Hemoptysis   [] Wheeze  [] COPD   [] Asthma Neurologic:  [x] Dizziness  [] Blackouts   [] Seizures   [] History of stroke   [] History of TIA  [] Aphasia   [] Temporary blindness   [] Dysphagia   [] Weakness or numbness in arms   [] Weakness or numbness in legs Musculoskeletal:  [] Arthritis   [] Joint swelling   [] Joint pain   [] Low back pain Hematologic:  [] Easy bruising  [] Easy bleeding   [] Hypercoagulable state   [] Anemic  [] Hepatitis Gastrointestinal:   [] Blood in stool   [] Vomiting blood  [x] Gastroesophageal reflux/heartburn   [] Abdominal pain Genitourinary:  [] Chronic kidney disease   [] Difficult urination  [] Frequent urination  [] Burning with urination   [] Hematuria Skin:  [] Rashes   [] Ulcers   [] Wounds Psychological:  [x] History of anxiety   [x]  History of major depression.    Physical Exam BP 128/83   Pulse 95   Resp 17   Ht 5\' 5"  (1.651 m)   Wt 255 lb 12.8 oz (116 kg)   LMP 08/27/2015 (LMP Unknown) Comment: abnormal bleeding  BMI 42.57 kg/m  Gen:  WD/WN, NAD. Obese. Head: [place]/AT, No temporalis wasting. Ear/Nose/Throat: Hearing grossly intact, nares w/o erythema or drainage, oropharynx w/o Erythema/Exudate Eyes: Conjunctiva clear, sclera non-icteric  Neck: trachea midline.  No bruit or JVD.  Pulmonary:  Good air movement, clear to auscultation bilaterally.  Cardiac: RRR, normal S1, S2 Vascular:  Vessel Right Left  Radial Palpable Palpable                                   Gastrointestinal: soft, non-tender/non-distended.  Musculoskeletal: M/S 5/5 throughout.  Extremities without ischemic changes.  No deformity or atrophy.  Neurologic: Sensation grossly intact in extremities.  Symmetrical.  Speech is fluent. Motor exam as listed above. Psychiatric: Judgment intact, Mood & affect appropriate for pt's clinical situation. Dermatologic: No rashes or ulcers noted.  No cellulitis or open wounds.    Radiology Mr Melven SartoriusBrain/iac W Wo Contrast  Result Date: 03/17/2017 CLINICAL DATA:  Gait imbalance during dizzy spells ; dizzy spells previously intermittent, now constant for 1-1/2 months. History of hypertension and diabetes. EXAM: MRI HEAD WITHOUT AND WITH CONTRAST TECHNIQUE: Multiplanar, multiecho pulse sequences of the brain and surrounding structures were obtained without and with intravenous contrast. CONTRAST:  20mL MULTIHANCE GADOBENATE DIMEGLUMINE 529 MG/ML IV SOLN COMPARISON:  None. FINDINGS: INTERNAL AUDITORY CANALS: No  cerebellar pontine angle masses. Normal course, caliber and signal of the bilateral seventh and eighth cranial nerves without abnormal enhancement. Vascular flow void at LEFT porous acusticus does not perturb the traversing nerve roots. True Symmetric, normal internal auditory canals. Normal appearance of the inner ear structures. INTRACRANIAL CONTENTS: No reduced diffusion to suggest acute ischemia or hypercellular tumor. No susceptibility artifact to suggest hemorrhage. The ventricles and sulci are normal for patient's age. Patchy supratentorial white matter FLAIR T2 hyperintensities. No suspicious parenchymal signal, mass lesions, mass effect. No abnormal intraparenchymal or extra-axial enhancement. No abnormal extra-axial fluid collections. No extra-axial masses. VASCULAR: T2 bright signal along the course of LEFT vertebral artery, with normal enhancement most compatible with  slow flow SKULL AND UPPER CERVICAL SPINE: No abnormal sellar expansion. No suspicious calvarial bone marrow signal. Craniocervical junction maintained. SINUSES/ ORBITS: The mastoid air-cells and included paranasal sinuses are well-aerated.The included ocular globes and orbital contents are non-suspicious. OTHER: None. IMPRESSION: Normal MRI of the internal auditory canals with and without contrast. Mild chronic small vessel ischemic disease. Apparent slow flow LEFT vertebral artery, consider angiography to assess for proximal stenosis. Electronically Signed   By: Awilda Metro M.D.   On: 03/17/2017 16:20    Labs No results found for this or any previous visit (from the past 2160 hour(s)).  Assessment/Plan:  Diabetes (HCC) blood glucose control important in reducing the progression of atherosclerotic disease. Also, involved in wound healing. On appropriate medications.   Essential hypertension, benign blood pressure control important in reducing the progression of atherosclerotic disease. On appropriate oral  medications.   Dizziness and giddiness Although extracranial cerebrovascular disease is not a particularly common cause of dizziness, given the recent MRI findings of think it is reasonable to evaluate at least initially with a carotid duplex which will also give [place] information about the vertebral arteries. This will be done at her convenience. I discussed the pathophysiology and natural history of carotid artery disease with the patient as well as vertebral artery disease with the patient. They voiced their understanding. She should continue her current medical regimen and should  consider taking an 81 mg aspirin dailyas well.       Festus Barren 04/07/2017, 3:22 PM   This note was created with Dragon medical transcription system.  Any errors from dictation are unintentional.

## 2017-04-16 ENCOUNTER — Encounter (INDEPENDENT_AMBULATORY_CARE_PROVIDER_SITE_OTHER): Payer: Self-pay | Admitting: Vascular Surgery

## 2017-04-16 ENCOUNTER — Ambulatory Visit (INDEPENDENT_AMBULATORY_CARE_PROVIDER_SITE_OTHER): Payer: 59

## 2017-04-16 ENCOUNTER — Ambulatory Visit (INDEPENDENT_AMBULATORY_CARE_PROVIDER_SITE_OTHER): Payer: 59 | Admitting: Vascular Surgery

## 2017-04-16 VITALS — BP 130/80 | HR 80 | Resp 17 | Ht 65.0 in | Wt 256.0 lb

## 2017-04-16 DIAGNOSIS — I6502 Occlusion and stenosis of left vertebral artery: Secondary | ICD-10-CM

## 2017-04-16 DIAGNOSIS — R42 Dizziness and giddiness: Secondary | ICD-10-CM | POA: Diagnosis not present

## 2017-04-16 NOTE — Progress Notes (Signed)
Subjective:    Patient ID: Alyssa Maynard, female    DOB: September 03, 1961, 56 y.o.   MRN: [id number] Chief Complaint  Patient presents with  . Carotid    Ultrasound follow up   Patient presents review vascular studies. Patient last seen on 04/07/2017. Patient continues to have at least one episode a week of "dizziness". This dizziness is severe and debilitating. The patient states she feels as if she's being "pulled towards the right". Patient underwent a bilateral carotid artery duplex exam this exam was notable for no stenosis in the right internal carotid artery. Nonhemodynamically significant 1-39% stenosis of the left bifurcation/proximal internal carotid artery. Evidence of possible left vertebral artery occlusion. Patient denies any amaurosis fugax, local neurological deficits. Patient recently diagnosed with stage III kidney failure.    Review of Systems  Constitutional: Negative.   HENT: Negative.   Eyes: Negative.   Respiratory: Negative.   Cardiovascular: Negative.   Gastrointestinal: Negative.   Endocrine: Negative.   Genitourinary: Negative.   Musculoskeletal: Negative.   Skin: Negative.   Allergic/Immunologic: Negative.   Neurological: Negative.   Hematological: Negative.   Psychiatric/Behavioral: Negative.       Objective:   Physical Exam  Constitutional: She is oriented to person, place, and time. She appears well-developed and well-nourished. No distress.  HENT:  Head: Normocephalic and atraumatic.  Eyes: Conjunctivae are normal. Pupils are equal, round, and reactive to light.  Neck: Normal range of motion.  No carotid bruits noted  Cardiovascular: Normal rate, regular rhythm, normal heart sounds and intact distal pulses.   Pulses:      Radial pulses are 2+ on the right side, and 2+ on the left side.  Pulmonary/Chest: Effort normal.  Musculoskeletal: Normal range of motion. She exhibits no edema.  Neurological: She is alert and oriented to person, place, and time.   Skin: Skin is warm and dry. She is not diaphoretic.  Psychiatric: She has a normal mood and affect. Her behavior is normal. Judgment and thought content normal.  Vitals reviewed.   BP 130/80 (BP Location: Right Arm)   Pulse 80   Resp 17   Ht 5\' 5"  (1.651 m)   Wt 256 lb (116.1 kg)   LMP 08/27/2015 (LMP Unknown) Comment: abnormal bleeding  BMI 42.60 kg/m   Past Medical History:  Diagnosis Date  . Anxiety   . Depression   . Diabetes mellitus without complication (HCC)   . GERD (gastroesophageal reflux disease)   . Hypertension     Social History   Social History  . Marital status: Married    Spouse name: N/A  . Number of children: N/A  . Years of education: N/A   Occupational History  . Not on file.   Social History Main Topics  . Smoking status: Current Every Day Smoker    Packs/day: 1.00  . Smokeless tobacco: Never Used  . Alcohol use 1.2 oz/week    2 Shots of liquor per week  . Drug use: No  . Sexual activity: Not on file   Other Topics Concern  . Not on file   Social History Narrative  . No narrative on file    Past Surgical History:  Procedure Laterality Date  . APPENDECTOMY    . CHOLECYSTECTOMY    . FOOT SURGERY    . HAND SURGERY    . HYSTEROSCOPY W/D&C N/A 11/02/2015   Procedure: DILATATION AND CURETTAGE /HYSTEROSCOPY;  Surgeon: Ala DachJohanna K Halfon, MD;  Location: [facility] ORS;  Service: Gynecology;  Laterality: N/A;  . LIVER SURGERY    . TONSILLECTOMY      Family History  Problem Relation Age of Onset  . Heart attack Mother   . Stroke Mother   . Heart attack Father   . Cancer Father     Allergies  Allergen Reactions  . Latex Rash and Other (See Comments)    Burns skin   . Other Other (See Comments)    Other Reaction: SKIN IRRITATION       Assessment & Plan:  Patient presents review vascular studies. Patient last seen on 04/07/2017. Patient continues to have at least one episode a week of "dizziness". This dizziness is severe and  debilitating. The patient states she feels as if she's being "pulled towards the right". Patient underwent a bilateral carotid artery duplex exam this exam was notable for no stenosis in the right internal carotid artery. Nonhemodynamically significant 1-39% stenosis of the left bifurcation/proximal internal carotid artery. Evidence of possible left vertebral artery occlusion. Patient denies any amaurosis fugax, local neurological deficits. Patient recently diagnosed with stage III kidney failure.   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 Patient continues to experience "dizziness". Patient experiences at least one episode of debilitating " "dizziness". Patient also states that he she's pulled towards "the right" Patient with recent diagnosis of stage III kidney insufficiency. Due to the kidney insufficiency recommend moving forward with a carotid angiogram to assess anatomy and degree of stenosis. We'll prophylax patient's kidneys before angiogram. Discussed with Dr. Gilda Crease  Stage Three Kidney Insufficiency - New Because of kidney failure recommend carotid angiogram instead of carotid CTA. We'll prophylax the kidneys before angiogram.  Patient exercised for understanding and is in agreement.  Current Outpatient Prescriptions on File Prior to Visit  Medication Sig Dispense Refill  . escitalopram (LEXAPRO) 10 MG tablet Take 10 mg by mouth daily.    [name] esomeprazole (NEXIUM) 40 MG capsule Take 40 mg by mouth daily.    [name] glimepiride (AMARYL) 4 MG tablet   0  . lisinopril-hydrochlorothiazide (PRINZIDE,ZESTORETIC) 20-12.5 MG tablet Take 1 tablet by mouth 2 (two) times daily.    . metFORMIN (GLUCOPHAGE) 850 MG tablet Take 850 mg by mouth 2 (two) times daily with a meal.    . omeprazole (PRILOSEC) 20 MG capsule Take 20 mg by mouth daily.    . sitaGLIPtin (JANUVIA) 50 MG tablet Take by mouth.     No current facility-administered medications on file prior to  visit.     There are no Patient Instructions on file for this visit. No Follow-up on file.   Decklin Weddington A Alizzon Dioguardi, PA-C

## 2017-04-17 ENCOUNTER — Encounter (INDEPENDENT_AMBULATORY_CARE_PROVIDER_SITE_OTHER): Payer: Self-pay

## 2017-04-18 LAB — VAS US CAROTID
LEFT ECA DIAS: -17 cm/s
Left CCA dist dias: -23 cm/s
Left CCA dist sys: -78 cm/s
Left CCA prox dias: 18 cm/s
Left CCA prox sys: 73 cm/s
Left ICA dist dias: -26 cm/s
Left ICA dist sys: -62 cm/s
Left ICA prox dias: 27 cm/s
Left ICA prox sys: 78 cm/s
RCCAPDIAS: 22 cm/s
RIGHT CCA MID DIAS: 28 cm/s
RIGHT ECA DIAS: -14 cm/s
Right CCA prox sys: 84 cm/s
Right cca dist sys: -62 cm/s

## 2017-04-21 ENCOUNTER — Other Ambulatory Visit (INDEPENDENT_AMBULATORY_CARE_PROVIDER_SITE_OTHER): Payer: Self-pay | Admitting: Vascular Surgery

## 2017-04-24 ENCOUNTER — Encounter
Admission: RE | Admit: 2017-04-24 | Discharge: 2017-04-24 | Disposition: A | Discharge status: Home / Self Care | Payer: 59 | Source: Ambulatory Visit | Attending: Vascular Surgery | Admitting: Vascular Surgery

## 2017-04-24 DIAGNOSIS — Z9049 Acquired absence of other specified parts of digestive tract: Secondary | ICD-10-CM | POA: Diagnosis not present

## 2017-04-24 DIAGNOSIS — I771 Stricture of artery: Secondary | ICD-10-CM | POA: Diagnosis not present

## 2017-04-24 DIAGNOSIS — Z809 Family history of malignant neoplasm, unspecified: Secondary | ICD-10-CM | POA: Diagnosis not present

## 2017-04-24 DIAGNOSIS — Z9109 Other allergy status, other than to drugs and biological substances: Secondary | ICD-10-CM | POA: Diagnosis not present

## 2017-04-24 DIAGNOSIS — F329 Major depressive disorder, single episode, unspecified: Secondary | ICD-10-CM | POA: Diagnosis not present

## 2017-04-24 DIAGNOSIS — F17210 Nicotine dependence, cigarettes, uncomplicated: Secondary | ICD-10-CM | POA: Diagnosis not present

## 2017-04-24 DIAGNOSIS — E1122 Type 2 diabetes mellitus with diabetic chronic kidney disease: Secondary | ICD-10-CM | POA: Diagnosis not present

## 2017-04-24 DIAGNOSIS — Z7984 Long term (current) use of oral hypoglycemic drugs: Secondary | ICD-10-CM | POA: Diagnosis not present

## 2017-04-24 DIAGNOSIS — Z9889 Other specified postprocedural states: Secondary | ICD-10-CM | POA: Diagnosis not present

## 2017-04-24 DIAGNOSIS — I129 Hypertensive chronic kidney disease with stage 1 through stage 4 chronic kidney disease, or unspecified chronic kidney disease: Secondary | ICD-10-CM | POA: Diagnosis not present

## 2017-04-24 DIAGNOSIS — F419 Anxiety disorder, unspecified: Secondary | ICD-10-CM | POA: Diagnosis not present

## 2017-04-24 DIAGNOSIS — Z91040 Latex allergy status: Secondary | ICD-10-CM | POA: Diagnosis not present

## 2017-04-24 DIAGNOSIS — K219 Gastro-esophageal reflux disease without esophagitis: Secondary | ICD-10-CM | POA: Diagnosis not present

## 2017-04-24 DIAGNOSIS — Z8249 Family history of ischemic heart disease and other diseases of the circulatory system: Secondary | ICD-10-CM | POA: Diagnosis not present

## 2017-04-24 DIAGNOSIS — N183 Chronic kidney disease, stage 3 (moderate): Secondary | ICD-10-CM | POA: Diagnosis not present

## 2017-04-24 DIAGNOSIS — Z823 Family history of stroke: Secondary | ICD-10-CM | POA: Diagnosis not present

## 2017-04-24 DIAGNOSIS — I6529 Occlusion and stenosis of unspecified carotid artery: Secondary | ICD-10-CM | POA: Diagnosis present

## 2017-04-24 HISTORY — DX: Sleep apnea, unspecified: G47.30

## 2017-04-24 HISTORY — DX: Other complications of anesthesia, initial encounter: T88.59XA

## 2017-04-24 HISTORY — DX: Dyspnea, unspecified: R06.00

## 2017-04-24 HISTORY — DX: Adverse effect of unspecified anesthetic, initial encounter: T41.45XA

## 2017-04-24 HISTORY — DX: Chronic kidney disease, unspecified: N18.9

## 2017-04-24 LAB — CREATININE, SERUM
Creatinine, Ser: 1.35 mg/dL — ABNORMAL HIGH (ref 0.44–1.00)
GFR calc non Af Amer: 42 mL/min — ABNORMAL LOW (ref >60)
GFR, EST AFRICAN AMERICAN: 49 mL/min — AB (ref >60)

## 2017-04-24 LAB — BUN: BUN: 20 mg/dL (ref 6–20)

## 2017-04-26 MED ORDER — CEFAZOLIN SODIUM-DEXTROSE 1-4 GM/50ML-% IV SOLN
1.0000 g | Freq: Once | INTRAVENOUS | Status: AC
  Administered 2017-04-27: 1 g via INTRAVENOUS

## 2017-04-27 ENCOUNTER — Ambulatory Visit
Admission: RE | Admit: 2017-04-27 | Discharge: 2017-04-27 | Disposition: A | Discharge status: Home / Self Care | Payer: 59 | Source: Ambulatory Visit | Attending: Vascular Surgery | Admitting: Vascular Surgery

## 2017-04-27 ENCOUNTER — Encounter
Admission: RE | Disposition: A | Discharge status: Home / Self Care | Payer: Self-pay | Source: Ambulatory Visit | Attending: Vascular Surgery

## 2017-04-27 ENCOUNTER — Encounter: Payer: Self-pay | Admitting: *Deleted

## 2017-04-27 DIAGNOSIS — F419 Anxiety disorder, unspecified: Secondary | ICD-10-CM | POA: Insufficient documentation

## 2017-04-27 DIAGNOSIS — Z823 Family history of stroke: Secondary | ICD-10-CM | POA: Insufficient documentation

## 2017-04-27 DIAGNOSIS — Z91040 Latex allergy status: Secondary | ICD-10-CM | POA: Insufficient documentation

## 2017-04-27 DIAGNOSIS — Z809 Family history of malignant neoplasm, unspecified: Secondary | ICD-10-CM | POA: Insufficient documentation

## 2017-04-27 DIAGNOSIS — K219 Gastro-esophageal reflux disease without esophagitis: Secondary | ICD-10-CM | POA: Insufficient documentation

## 2017-04-27 DIAGNOSIS — I6529 Occlusion and stenosis of unspecified carotid artery: Secondary | ICD-10-CM | POA: Insufficient documentation

## 2017-04-27 DIAGNOSIS — Z9109 Other allergy status, other than to drugs and biological substances: Secondary | ICD-10-CM | POA: Insufficient documentation

## 2017-04-27 DIAGNOSIS — Z9889 Other specified postprocedural states: Secondary | ICD-10-CM | POA: Insufficient documentation

## 2017-04-27 DIAGNOSIS — I6522 Occlusion and stenosis of left carotid artery: Secondary | ICD-10-CM | POA: Diagnosis not present

## 2017-04-27 DIAGNOSIS — N183 Chronic kidney disease, stage 3 (moderate): Secondary | ICD-10-CM | POA: Insufficient documentation

## 2017-04-27 DIAGNOSIS — Z9049 Acquired absence of other specified parts of digestive tract: Secondary | ICD-10-CM | POA: Insufficient documentation

## 2017-04-27 DIAGNOSIS — F17210 Nicotine dependence, cigarettes, uncomplicated: Secondary | ICD-10-CM | POA: Insufficient documentation

## 2017-04-27 DIAGNOSIS — Z8249 Family history of ischemic heart disease and other diseases of the circulatory system: Secondary | ICD-10-CM | POA: Insufficient documentation

## 2017-04-27 DIAGNOSIS — I129 Hypertensive chronic kidney disease with stage 1 through stage 4 chronic kidney disease, or unspecified chronic kidney disease: Secondary | ICD-10-CM | POA: Insufficient documentation

## 2017-04-27 DIAGNOSIS — I771 Stricture of artery: Secondary | ICD-10-CM | POA: Insufficient documentation

## 2017-04-27 DIAGNOSIS — Z7984 Long term (current) use of oral hypoglycemic drugs: Secondary | ICD-10-CM | POA: Insufficient documentation

## 2017-04-27 DIAGNOSIS — E1122 Type 2 diabetes mellitus with diabetic chronic kidney disease: Secondary | ICD-10-CM | POA: Insufficient documentation

## 2017-04-27 DIAGNOSIS — F329 Major depressive disorder, single episode, unspecified: Secondary | ICD-10-CM | POA: Insufficient documentation

## 2017-04-27 HISTORY — PX: CAROTID ANGIOGRAPHY: CATH118230

## 2017-04-27 LAB — GLUCOSE, CAPILLARY: GLUCOSE-CAPILLARY: 175 mg/dL — AB (ref 65–99)

## 2017-04-27 SURGERY — CAROTID ANGIOGRAPHY
Anesthesia: Moderate Sedation

## 2017-04-27 MED ORDER — ACETAMINOPHEN 325 MG PO TABS: 325.0000 mg | ORAL_TABLET | ORAL | Status: DC | PRN

## 2017-04-27 MED ORDER — SODIUM CHLORIDE 0.9 % IV SOLN
INTRAVENOUS | Status: DC
  Administered 2017-04-27: 11:00:00 via INTRAVENOUS

## 2017-04-27 MED ORDER — OXYCODONE-ACETAMINOPHEN 5-325 MG PO TABS: 1.0000 | ORAL_TABLET | ORAL | Status: DC | PRN

## 2017-04-27 MED ORDER — SODIUM CHLORIDE 0.9 % IV SOLN: 500.0000 mL | Freq: Once | INTRAVENOUS | Status: DC | PRN

## 2017-04-27 MED ORDER — PHENOL 1.4 % MT LIQD
1.0000 | OROMUCOSAL | Status: DC | PRN
  Filled 2017-04-27: qty 177

## 2017-04-27 MED ORDER — MIDAZOLAM HCL 2 MG/2ML IJ SOLN
INTRAMUSCULAR | Status: DC | PRN
  Administered 2017-04-27: 1 mg via INTRAVENOUS
  Administered 2017-04-27: 2 mg via INTRAVENOUS

## 2017-04-27 MED ORDER — METHYLPREDNISOLONE SODIUM SUCC 125 MG IJ SOLR: 125.0000 mg | INTRAMUSCULAR | Status: DC | PRN

## 2017-04-27 MED ORDER — HYDROMORPHONE HCL 1 MG/ML IJ SOLN: 1.0000 mg | Freq: Once | INTRAMUSCULAR | Status: DC | PRN

## 2017-04-27 MED ORDER — FAMOTIDINE 20 MG PO TABS: 40.0000 mg | ORAL_TABLET | ORAL | Status: DC | PRN

## 2017-04-27 MED ORDER — GUAIFENESIN-DM 100-10 MG/5ML PO SYRP
15.0000 mL | ORAL_SOLUTION | ORAL | Status: DC | PRN
  Filled 2017-04-27: qty 15

## 2017-04-27 MED ORDER — FENTANYL CITRATE (PF) 100 MCG/2ML IJ SOLN
INTRAMUSCULAR | Status: AC
  Filled 2017-04-27: qty 2

## 2017-04-27 MED ORDER — HYDRALAZINE HCL 20 MG/ML IJ SOLN: 5.0000 mg | INTRAMUSCULAR | Status: DC | PRN

## 2017-04-27 MED ORDER — ONDANSETRON HCL 4 MG/2ML IJ SOLN: 4.0000 mg | Freq: Four times a day (QID) | INTRAMUSCULAR | Status: DC | PRN

## 2017-04-27 MED ORDER — METOPROLOL TARTRATE 5 MG/5ML IV SOLN: 2.0000 mg | INTRAVENOUS | Status: DC | PRN

## 2017-04-27 MED ORDER — IOPAMIDOL (ISOVUE-300) INJECTION 61%
INTRAVENOUS | Status: DC | PRN
  Administered 2017-04-27: 90 mL via INTRA_ARTERIAL

## 2017-04-27 MED ORDER — FENTANYL CITRATE (PF) 100 MCG/2ML IJ SOLN
INTRAMUSCULAR | Status: DC | PRN
  Administered 2017-04-27: 25 ug via INTRAVENOUS
  Administered 2017-04-27: 50 ug via INTRAVENOUS

## 2017-04-27 MED ORDER — LIDOCAINE-EPINEPHRINE (PF) 2 %-1:200000 IJ SOLN
INTRAMUSCULAR | Status: AC
  Filled 2017-04-27: qty 20

## 2017-04-27 MED ORDER — HYDROMORPHONE HCL 1 MG/ML IJ SOLN: 0.5000 mg | INTRAMUSCULAR | Status: DC | PRN

## 2017-04-27 MED ORDER — ATROPINE SULFATE 1 MG/10ML IJ SOSY
PREFILLED_SYRINGE | INTRAMUSCULAR | Status: DC
Start: 2017-04-27 — End: 2017-04-27
  Filled 2017-04-27: qty 10

## 2017-04-27 MED ORDER — LABETALOL HCL 5 MG/ML IV SOLN: 10.0000 mg | INTRAVENOUS | Status: DC | PRN

## 2017-04-27 MED ORDER — HEPARIN SODIUM (PORCINE) 1000 UNIT/ML IJ SOLN
INTRAMUSCULAR | Status: AC
  Filled 2017-04-27: qty 1

## 2017-04-27 MED ORDER — ACETAMINOPHEN 325 MG RE SUPP
325.0000 mg | RECTAL | Status: DC | PRN
  Filled 2017-04-27: qty 2

## 2017-04-27 MED ORDER — HEPARIN SODIUM (PORCINE) 1000 UNIT/ML IJ SOLN
INTRAMUSCULAR | Status: DC | PRN
  Administered 2017-04-27: 3000 [IU] via INTRAVENOUS

## 2017-04-27 MED ORDER — HEPARIN (PORCINE) IN NACL 2-0.9 UNIT/ML-% IJ SOLN
INTRAMUSCULAR | Status: AC
  Filled 2017-04-27: qty 500

## 2017-04-27 MED ORDER — MIDAZOLAM HCL 5 MG/5ML IJ SOLN
INTRAMUSCULAR | Status: AC
  Filled 2017-04-27: qty 5

## 2017-04-27 SURGICAL SUPPLY — 9 items
CATH 5FR JB2 100CM (CATHETERS) ×1 IMPLANT
CATH ANGIO 5F 100CM .035 PIG (CATHETERS) ×1 IMPLANT
CATH H1 100CM (CATHETERS) ×1 IMPLANT
DEVICE STARCLOSE SE CLOSURE (Vascular Products) ×1 IMPLANT
GLIDEWIRE ANGLED SS 035X260CM (WIRE) ×2 IMPLANT
KIT CAROTID MANIFOLD (MISCELLANEOUS) ×1 IMPLANT
PACK ANGIOGRAPHY (CUSTOM PROCEDURE TRAY) ×1 IMPLANT
SHEATH BRITE TIP 5FRX11 (SHEATH) ×1 IMPLANT
WIRE J 3MM .035X145CM (WIRE) ×1 IMPLANT

## 2017-04-27 NOTE — Op Note (Signed)
[facility]   OPERATIVE NOTE  DATE: 04/27/2017  PRE-OPERATIVE DIAGNOSIS: 1. carotid artery stenosis, vertebral artery occlusive disease 2. Chronic kidney disease precluding CT angiogram  POST-OPERATIVE DIAGNOSIS: Same as above  PROCEDURE: 1.   Ultrasound Guidance for vascular access right femoral artery 2.   Catheter placement into left subclavian artery, right vertebral artery, right common carotid artery, and into left common carotid artery from right femoral approach 3.   Thoracic aortogram 4.   Cervical and cerebral bilateral carotid angiograms 5.   Left subclavian/vertebral angiogram 6.   Right subclavian/vertebral angiogram 7.   StarClose closure device right femoral artery  SURGEON: Festus BarrenJason Shayna Eblen, MD  ASSISTANT(S): None  ANESTHESIA: Moderate conscious sedation  ESTIMATED BLOOD LOSS: 10 cc  FLUORO TIME: 9 minutes  CONTRAST: 90 cc  MODERATE CONSCIOUS SEDATION TIME: Approximately 30 minutes using 3 of Versed and 75 mcg of Fentanyl  FINDING(S): 1.  Left subclavian artery widely patent left vertebral artery is occluded with no reconstitution initially seen on selective left subclavian imaging. Right subclavian artery is widely patent and the right vertebral artery is large and dominant. No significant stenosis in the vertebral artery with brisk flow intracranially through the vertebral artery. Right carotid artery demonstrated essentially no stenosis with significant tortuosity seen. Intracranial filling was normal in both the anterior and middle cerebral arteries. Left carotid artery with very mild stenosis in the 15-20% range with some tortuosity seen. Intracranial filling was also normal on the left in both the anterior and middle cerebral arteries.  SPECIMEN(S):  None  INDICATIONS:   Patient is a 56 y.o.female who presents with vertebral disease and dizziness. The patient's renal function precluded CT angiogram. Catheter-based angiogram is performed  for further evaluation. Risks and benefits are discussed and informed consent was obtained.  DESCRIPTION: After obtaining full informed written consent, the patient was brought back to the operating room and placed supine upon the vascular suite table.  After obtaining adequate anesthesia, the patient was prepped and draped in the standard fashion.  Moderate conscious sedation was administered during a face to face encounter with the patient throughout the procedure with my supervision of the RN administering medicines and monitoring the patients vital signs and mental status throughout from the start of the procedure until the patient was taken to the recovery room. The right femoral artery was visualized with ultrasound and found to be calcific but patent. It was then accessed under direct ultrasound guidance without difficulty with a Seldinger needle. A J-wire and 5 French sheath were placed and a permanent image was recorded. The patient was given 3000 units of intravenous heparin. A pigtail catheter was placed into the ascending aorta and an LAO projection thoracic aortogram was performed. This showed normal origins of the great vessels proximally.  I then selected a headhunter catheter and started with selective cannulation of the left subclavian artery. This was easily cannulated and selective imaging was performed. This demonstrated no significant subclavian artery stenosis, but the vertebral artery was chronically occluded and no vertebral artery flow was identified on the left. I then turned my attention back to the aortic arch and used a headhunter catheter again and cannulated the innominate artery advancing into the mid right common carotid artery. Cervical and cerebral carotid angiography were then performed on the right side initially. The intracranial flow was found to be normal with no filling deficits. The cervical carotid artery was tortuous but no significant stenosis was identified. I then  went to an RAO  projection and was able to pull the catheter back into the innominate artery and then advanced into the right subclavian artery. Imaging was performed which showed the right subclavian artery be widely patent and the origin of the vertebral artery to be patent. Imaging distally was more difficult due to flow through the carotid artery as well and appearing visualization. I then selectively cannulated the vertebral artery without difficulty with a headhunter catheter and the Glidewire perform selective imaging. This demonstrated a large, dominant vertebral artery without any significant stenosis and with brisk intracranial filling on the right. I then turned my attention back to the thoracic aorta and removed the catheter from the right side. I selectively cannulated the left common carotid artery without difficulty with a JB2 catheter and advanced into the mid left common carotid artery. Selective imaging was then performed of the cervical and cerebral carotid artery on the left. Intracranial filling was normal with a normal anterior and middle cerebral artery with no filling deficits. The cervical carotid artery was very mildly diseased and only the 15-20% range with mild tortuosity as well. Multiple views were taken in the cervical carotid artery bilaterally. At this point, we had imaging to plan our treatment and we elected to terminate the procedure. The diagnostic catheter was removed. Oblique arteriogram was performed of the right femoral artery and StarClose closure device was deployed in usual fashion with excellent hemostatic result. The patient tolerated the procedure well and was taken to the recovery room in stable condition.  COMPLICATIONS: None  CONDITION: Stable   Festus Barren 04/27/2017 1:15 PM   This note was created with Dragon Medical transcription system. Any errors in dictation are purely unintentional.

## 2017-04-27 NOTE — H&P (Signed)
[facility] History & Physical Update  The patient was interviewed and re-examined.  The patient's previous History and Physical has been reviewed and is unchanged.  There is no change in the plan of care. We plan to proceed with the scheduled procedure.  Festus BarrenJason Dew, MD  04/27/2017, 10:43 AM

## 2017-04-28 ENCOUNTER — Encounter: Payer: Self-pay | Admitting: Vascular Surgery

## 2017-05-31 DIAGNOSIS — M5416 Radiculopathy, lumbar region: Secondary | ICD-10-CM | POA: Insufficient documentation

## 2017-06-09 ENCOUNTER — Ambulatory Visit (INDEPENDENT_AMBULATORY_CARE_PROVIDER_SITE_OTHER): Payer: 59 | Admitting: Vascular Surgery

## 2017-06-10 ENCOUNTER — Ambulatory Visit (INDEPENDENT_AMBULATORY_CARE_PROVIDER_SITE_OTHER): Payer: 59 | Admitting: Orthopaedic Surgery

## 2017-07-03 ENCOUNTER — Telehealth (INDEPENDENT_AMBULATORY_CARE_PROVIDER_SITE_OTHER): Payer: Self-pay

## 2017-07-13 ENCOUNTER — Ambulatory Visit (INDEPENDENT_AMBULATORY_CARE_PROVIDER_SITE_OTHER): Payer: 59 | Admitting: Physician Assistant

## 2017-07-13 ENCOUNTER — Encounter (INDEPENDENT_AMBULATORY_CARE_PROVIDER_SITE_OTHER): Payer: Self-pay | Admitting: Physician Assistant

## 2017-07-13 ENCOUNTER — Ambulatory Visit (INDEPENDENT_AMBULATORY_CARE_PROVIDER_SITE_OTHER): Payer: 59

## 2017-07-13 DIAGNOSIS — M5442 Lumbago with sciatica, left side: Secondary | ICD-10-CM | POA: Insufficient documentation

## 2017-07-13 NOTE — Progress Notes (Signed)
Office Visit Note   Patient: Alyssa Maynard           Date of Birth: 06/21/1961           MRN: [id number] Visit Date: 07/13/2017              Requested by: Alyssa Maynard, Jasmine, MD [address] [facility] [place], [place] [id number] PCP: Alyssa Maynard, Jasmine, MD   Assessment & Plan: Visit Diagnoses:  1. Acute back pain with sciatica, left     Plan: Due to patient's long term pain now approximately 4 months or lack of response to spike conservative management which includes medications besides therapy acupuncture dry needling chiropractor. Would recommend MRI of her lumbar spine to rule out HNP as the source of her pain down the left leg. She'll follow up after the MRI to go over the results and discuss further treatment. In the interim recommend moist heat. She's unable take NSAIDs due to her kidney function.  Follow-Up Instructions: Return for After MRI.   Orders:  Orders Placed This Encounter  Procedures  . XR Lumbar Spine 2-3 Views  . MR Lumbar Spine w/o contrast   No orders of the defined types were placed in this encounter.     Procedures: No procedures performed   Clinical Data: No additional findings.   Subjective: Chief Complaint  Patient presents with  . Lower Back - Pain    HPI Alyssa Maynard is a 56 year old female were seen for the first time today. She states she's had some low back pain since she [place] fell at the beach on [place]2017. No loss of consciousness dizziness at that time. He didn't fell again at her home in July when her leg when out. Again no loss of consciousness or dizziness. She's had low back pain since that time which is worse with standing. States her pain can be 10 out of 10 pain at its worse. She's had no bowel or bladder dysfunction. She's seen a chiropractor had deep tissue massage dry needling or acupuncture prednisone Dosepak muscle relaxants despite all of this continues to have pain from her low back and tingling down her left  leg. Review of Systems Denies loss consciousness dizziness chest pain shortness breath bowel bladder dysfunction.  Objective: Vital Signs: LMP 08/27/2015 (LMP Unknown) Comment: abnormal bleeding  Physical Exam  Constitutional: She is oriented to person, place, and time. She appears well-developed and well-nourished. No distress.  Cardiovascular: Intact distal pulses.   Pulmonary/Chest: Effort normal.  Neurological: She is alert and oriented to person, place, and time.  Skin: She is not diaphoretic.  Psychiatric: She has a normal mood and affect.    Ortho Exam Follow him as good range of motion without pain. Positive straight leg raise on the left negative on the right. Tenderness in the left lower paraspinous region at L5 S1 levels. She's able to touch her toes. She has some  pain with extension of lumbar spine. Deep tendon reflexes are 2+ at the knees and 1+ at the ankles and equal and symmetric. Specialty Comments:  No specialty comments available.  Imaging: Xr Lumbar Spine 2-3 Views  Result Date: 07/13/2017 Lumbar spine AP lateral views: No acute fracture. Disc space well maintained. Slight anterior spondylolisthesis L5 on S1. Normal lordotic curvature maintained. No other bony abnormalities.    PMFS History: Patient Active Problem List   Diagnosis Date Noted  . Acute back pain with sciatica, left 07/13/2017  . Lumbar radiculopathy 05/31/2017  .  Type 2 diabetes mellitus with stage 3 chronic kidney disease, without long-term current use of insulin (HCC) 04/07/2017  . Essential hypertension 04/07/2017  . Dizziness and giddiness 04/07/2017  . Prolonged Q-T interval on ECG 02/17/2017  . CKD (chronic kidney disease) stage 3, GFR 30-59 ml/min 08/12/2016  . Mixed anxiety and depressive disorder 04/26/2015  . Gastroesophageal reflux disease without esophagitis 04/26/2015  . Tobacco dependence 06/07/2014  . OSA (obstructive sleep apnea) 06/07/2014  . HTN (hypertension) 06/07/2014    . Depression 06/07/2014   Past Medical History:  Diagnosis Date  . Anxiety   . Chronic kidney disease 04/2017   stage 3  . Complication of anesthesia    pt experiences difficulty breathing after anesthesia due to sleep apnea and smoking  . Depression   . Diabetes mellitus without complication (HCC)   . Dyspnea   . GERD (gastroesophageal reflux disease)   . Hypertension   . Sleep apnea     Family History  Problem Relation Age of Onset  . Heart attack Mother   . Stroke Mother   . Heart attack Father   . Cancer Father     Past Surgical History:  Procedure Laterality Date  . APPENDECTOMY    . CAROTID ANGIOGRAPHY N/A 04/27/2017   Procedure: Carotid Angiography;  Surgeon: Annice Needy, MD;  Location: [facility] INVASIVE CV LAB;  Service: Cardiovascular;  Laterality: N/A;  . CHOLECYSTECTOMY    . FOOT SURGERY Left   . HAND SURGERY Right   . HYSTEROSCOPY W/D&C N/A 11/02/2015   Procedure: DILATATION AND CURETTAGE /HYSTEROSCOPY;  Surgeon: Ala Dach, MD;  Location: [facility] ORS;  Service: Gynecology;  Laterality: N/A;  . HYSTEROSCOPY W/D&C    . LIVER SURGERY    . TONSILLECTOMY    . UPPER GI ENDOSCOPY     Social History   Occupational History  . Not on file.   Social History Main Topics  . Smoking status: Current Every Day Smoker    Packs/day: 1.00  . Smokeless tobacco: Never Used  . Alcohol use 8.4 oz/week    14 Shots of liquor per week  . Drug use: No  . Sexual activity: Not on file

## 2017-07-22 ENCOUNTER — Ambulatory Visit
Admission: RE | Admit: 2017-07-22 | Discharge: 2017-07-22 | Disposition: A | Discharge status: Home / Self Care | Payer: 59 | Source: Ambulatory Visit | Attending: Physician Assistant | Admitting: Physician Assistant

## 2017-07-22 DIAGNOSIS — M5442 Lumbago with sciatica, left side: Secondary | ICD-10-CM

## 2017-07-25 ENCOUNTER — Other Ambulatory Visit: Payer: 59

## 2017-07-29 ENCOUNTER — Ambulatory Visit (INDEPENDENT_AMBULATORY_CARE_PROVIDER_SITE_OTHER): Payer: 59 | Admitting: Physician Assistant

## 2017-07-29 ENCOUNTER — Encounter (INDEPENDENT_AMBULATORY_CARE_PROVIDER_SITE_OTHER): Payer: Self-pay | Admitting: Physician Assistant

## 2017-07-29 DIAGNOSIS — M5442 Lumbago with sciatica, left side: Secondary | ICD-10-CM | POA: Diagnosis not present

## 2017-07-29 NOTE — Progress Notes (Signed)
Alyssa Maynard returns today follow-up of her MRI lumbar spine. She continues to have pain down the left leg is posterior aspect of the leg down the posterior aspect of the left lower leg and into the bottom of her left foot. Again all of her pain began whenever she fell while at the beach on 03/25/2017 and then she fell again in July 2018 at her home. Pain in her low back and radicular symptoms are worse whenever she is standing. She is tried dry needling she seen a chiropractor taken prednisone and muscle relaxants and despite this continues to have pain and tingling down the left leg.  MRI dated 07/22/2017: MRI findings are reviewed with patient showing her images and using a model of the spine for visual demonstration. 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 Moderate left and borderline right foraminal stenosis due to intravertebral spurring. L4-5 mild left and borderline right foraminal stenosis with mild left and borderline right side articular lateral recess stenosis due to disc and facet arthropathy.  Impression: Low back pain with radicular symptoms down the left leg  Plan:We'll refer her for possible left L5 transforaminal injection to follow with [place] in 1 month to check her progress lack of. Questions encouraged and answered length.

## 2017-08-03 ENCOUNTER — Other Ambulatory Visit (INDEPENDENT_AMBULATORY_CARE_PROVIDER_SITE_OTHER): Payer: Self-pay | Admitting: Physician Assistant

## 2017-08-03 DIAGNOSIS — M5442 Lumbago with sciatica, left side: Secondary | ICD-10-CM

## 2017-08-12 ENCOUNTER — Other Ambulatory Visit (INDEPENDENT_AMBULATORY_CARE_PROVIDER_SITE_OTHER): Payer: Self-pay | Admitting: Physician Assistant

## 2017-08-14 ENCOUNTER — Ambulatory Visit
Admission: RE | Admit: 2017-08-14 | Discharge: 2017-08-14 | Disposition: A | Discharge status: Home / Self Care | Payer: 59 | Source: Ambulatory Visit | Attending: Physician Assistant | Admitting: Physician Assistant

## 2017-08-14 DIAGNOSIS — M5442 Lumbago with sciatica, left side: Secondary | ICD-10-CM

## 2017-08-14 MED ORDER — METHYLPREDNISOLONE ACETATE 40 MG/ML INJ SUSP (RADIOLOG
120.0000 mg | Freq: Once | INTRAMUSCULAR | Status: AC
  Administered 2017-08-14: 120 mg via EPIDURAL

## 2017-08-14 MED ORDER — IOPAMIDOL (ISOVUE-M 200) INJECTION 41%
1.0000 mL | Freq: Once | INTRAMUSCULAR | Status: AC
  Administered 2017-08-14: 1 mL via EPIDURAL

## 2017-08-14 NOTE — Discharge Instructions (Signed)

## 2017-08-26 NOTE — Telephone Encounter (Signed)
Note made in error

## 2017-08-27 ENCOUNTER — Ambulatory Visit (INDEPENDENT_AMBULATORY_CARE_PROVIDER_SITE_OTHER): Payer: 59 | Admitting: Physician Assistant

## 2017-08-31 ENCOUNTER — Ambulatory Visit (INDEPENDENT_AMBULATORY_CARE_PROVIDER_SITE_OTHER): Payer: 59 | Admitting: Physician Assistant

## 2017-08-31 ENCOUNTER — Encounter (INDEPENDENT_AMBULATORY_CARE_PROVIDER_SITE_OTHER): Payer: Self-pay | Admitting: Physician Assistant

## 2017-08-31 DIAGNOSIS — M5416 Radiculopathy, lumbar region: Secondary | ICD-10-CM | POA: Diagnosis not present

## 2017-08-31 NOTE — Progress Notes (Signed)
Mrs. returns today for follow-up on her lumbar spine.  She had a transforaminal epidural [facility] L[facility]  She states that initially she did not have any results from the injection but then approximately 3-4 days later had good relief for approximately 3 days.  Now her pain has returned and she is having radicular symptoms down her left leg pain radiates down into the foot.  She is unable to verbalize exactly where the pain is but it does involve the plantar aspect of the foot.  Pain is limiting her activities.  She has had no bowel or bladder incontinence.  She states she has had 2 incidents where she has woke up with sweating but otherwise no fevers chills.  Review of systems:Please see HPI otherwise negative physical exam  : Negative straight leg raise bilaterally.  Tight hamstrings bilaterally.  Impression: Low back pain with radicular symptoms left leg Plan: Due to the fact that she is failed conservative treatment including epidural steroid injections and oral medications, we will refer her to neurosurgery for further treatment and evaluation.

## 2017-09-08 DIAGNOSIS — M5136 Other intervertebral disc degeneration, lumbar region: Secondary | ICD-10-CM | POA: Insufficient documentation

## 2017-09-08 DIAGNOSIS — M51369 Other intervertebral disc degeneration, lumbar region without mention of lumbar back pain or lower extremity pain: Secondary | ICD-10-CM | POA: Insufficient documentation

## 2017-09-16 ENCOUNTER — Telehealth (INDEPENDENT_AMBULATORY_CARE_PROVIDER_SITE_OTHER): Payer: Self-pay | Admitting: *Deleted

## 2017-09-16 NOTE — Telephone Encounter (Signed)
Pt husband called inquiring about referral to Neurosurgeon stated has not heard anything and has been 2 weeks since last visit here   I called pt back and left vm stating referral was faxed on 09/03/17 and I had called [facility] and [facility] Carly and she stated that received referral and pt is on the call list and will be calling pt to schedule appt.

## 2017-09-18 ENCOUNTER — Telehealth (INDEPENDENT_AMBULATORY_CARE_PROVIDER_SITE_OTHER): Payer: Self-pay | Admitting: Physician Assistant

## 2017-09-18 NOTE — Telephone Encounter (Signed)
Patients husband called and was stating that appt with [facility] has not been scheduled yet. I told him I saw where they are on the call list to be scheduled.  Patient is in a lot of pain and needs some kind of pain medication to help her while they wait for appt. Was told that Bronson CurbGil said he would be able to give her something if needed-  Patients husband asked to be contacted once a medication is prescribed.  [phone]

## 2017-09-21 ENCOUNTER — Other Ambulatory Visit (INDEPENDENT_AMBULATORY_CARE_PROVIDER_SITE_OTHER): Payer: Self-pay

## 2017-09-21 MED ORDER — ACETAMINOPHEN-CODEINE #3 300-30 MG PO TABS: 1.0000 | ORAL_TABLET | Freq: Three times a day (TID) | ORAL | 0 refills | Status: DC | PRN

## 2017-09-21 NOTE — Telephone Encounter (Signed)
The only thing we can provide is tramadol or tylenol #3, 1-2 every 8 hours as needed, #60, no refills.

## 2017-09-21 NOTE — Telephone Encounter (Signed)
Please advise 

## 2017-09-21 NOTE — Telephone Encounter (Signed)
Called into pharmacy; patient aware  

## 2018-07-03 ENCOUNTER — Inpatient Hospital Stay
Admission: EM | Admit: 2018-07-03 | Discharge: 2018-07-08 | DRG: 493 | Disposition: A | Discharge status: Home Health | Payer: 59 | Attending: Orthopedic Surgery | Admitting: Orthopedic Surgery

## 2018-07-03 ENCOUNTER — Other Ambulatory Visit: Payer: Self-pay

## 2018-07-03 ENCOUNTER — Emergency Department: Payer: 59

## 2018-07-03 DIAGNOSIS — K219 Gastro-esophageal reflux disease without esophagitis: Secondary | ICD-10-CM | POA: Diagnosis present

## 2018-07-03 DIAGNOSIS — Z9049 Acquired absence of other specified parts of digestive tract: Secondary | ICD-10-CM

## 2018-07-03 DIAGNOSIS — Z823 Family history of stroke: Secondary | ICD-10-CM

## 2018-07-03 DIAGNOSIS — Z809 Family history of malignant neoplasm, unspecified: Secondary | ICD-10-CM

## 2018-07-03 DIAGNOSIS — F17210 Nicotine dependence, cigarettes, uncomplicated: Secondary | ICD-10-CM | POA: Diagnosis present

## 2018-07-03 DIAGNOSIS — F329 Major depressive disorder, single episode, unspecified: Secondary | ICD-10-CM | POA: Diagnosis present

## 2018-07-03 DIAGNOSIS — Z7984 Long term (current) use of oral hypoglycemic drugs: Secondary | ICD-10-CM

## 2018-07-03 DIAGNOSIS — S82121A Displaced fracture of lateral condyle of right tibia, initial encounter for closed fracture: Secondary | ICD-10-CM

## 2018-07-03 DIAGNOSIS — Z91040 Latex allergy status: Secondary | ICD-10-CM

## 2018-07-03 DIAGNOSIS — I129 Hypertensive chronic kidney disease with stage 1 through stage 4 chronic kidney disease, or unspecified chronic kidney disease: Secondary | ICD-10-CM | POA: Diagnosis present

## 2018-07-03 DIAGNOSIS — G4730 Sleep apnea, unspecified: Secondary | ICD-10-CM | POA: Diagnosis present

## 2018-07-03 DIAGNOSIS — E1122 Type 2 diabetes mellitus with diabetic chronic kidney disease: Secondary | ICD-10-CM | POA: Diagnosis present

## 2018-07-03 DIAGNOSIS — W109XXA Fall (on) (from) unspecified stairs and steps, initial encounter: Secondary | ICD-10-CM | POA: Diagnosis present

## 2018-07-03 DIAGNOSIS — G4733 Obstructive sleep apnea (adult) (pediatric): Secondary | ICD-10-CM | POA: Diagnosis present

## 2018-07-03 DIAGNOSIS — R52 Pain, unspecified: Secondary | ICD-10-CM | POA: Diagnosis not present

## 2018-07-03 DIAGNOSIS — Z419 Encounter for procedure for purposes other than remedying health state, unspecified: Secondary | ICD-10-CM

## 2018-07-03 DIAGNOSIS — E669 Obesity, unspecified: Secondary | ICD-10-CM | POA: Diagnosis present

## 2018-07-03 DIAGNOSIS — F419 Anxiety disorder, unspecified: Secondary | ICD-10-CM | POA: Diagnosis present

## 2018-07-03 DIAGNOSIS — Y92009 Unspecified place in unspecified non-institutional (private) residence as the place of occurrence of the external cause: Secondary | ICD-10-CM

## 2018-07-03 DIAGNOSIS — Z6841 Body Mass Index (BMI) 40.0 and over, adult: Secondary | ICD-10-CM

## 2018-07-03 DIAGNOSIS — N183 Chronic kidney disease, stage 3 (moderate): Secondary | ICD-10-CM | POA: Diagnosis present

## 2018-07-03 DIAGNOSIS — S82143A Displaced bicondylar fracture of unspecified tibia, initial encounter for closed fracture: Secondary | ICD-10-CM | POA: Diagnosis present

## 2018-07-03 DIAGNOSIS — S82141A Displaced bicondylar fracture of right tibia, initial encounter for closed fracture: Principal | ICD-10-CM | POA: Diagnosis present

## 2018-07-03 DIAGNOSIS — S82101A Unspecified fracture of upper end of right tibia, initial encounter for closed fracture: Secondary | ICD-10-CM | POA: Diagnosis present

## 2018-07-03 DIAGNOSIS — Z8249 Family history of ischemic heart disease and other diseases of the circulatory system: Secondary | ICD-10-CM

## 2018-07-03 LAB — BASIC METABOLIC PANEL
ANION GAP: 14 (ref 5–15)
BUN: 19 mg/dL (ref 6–20)
CO2: 22 mmol/L (ref 22–32)
Calcium: 9.5 mg/dL (ref 8.9–10.3)
Chloride: 100 mmol/L (ref 98–111)
Creatinine, Ser: 1.45 mg/dL — ABNORMAL HIGH (ref 0.44–1.00)
GFR calc Af Amer: 45 mL/min — ABNORMAL LOW (ref >60)
GFR calc non Af Amer: 39 mL/min — ABNORMAL LOW (ref >60)
Glucose, Bld: 106 mg/dL — ABNORMAL HIGH (ref 70–99)
Potassium: 3.2 mmol/L — ABNORMAL LOW (ref 3.5–5.1)
Sodium: 136 mmol/L (ref 135–145)

## 2018-07-03 MED ORDER — ONDANSETRON HCL 4 MG/2ML IJ SOLN
4.0000 mg | Freq: Once | INTRAMUSCULAR | Status: AC | PRN
  Administered 2018-07-03: 4 mg via INTRAVENOUS
  Filled 2018-07-03: qty 2

## 2018-07-03 MED ORDER — KETAMINE HCL 10 MG/ML IJ SOLN: 0.3000 mg/kg | Freq: Once | INTRAMUSCULAR | Status: DC

## 2018-07-03 MED ORDER — KETOROLAC TROMETHAMINE 30 MG/ML IJ SOLN
15.0000 mg | INTRAMUSCULAR | Status: AC
  Administered 2018-07-04: 15 mg via INTRAVENOUS
  Filled 2018-07-03: qty 1

## 2018-07-03 MED ORDER — HYDROMORPHONE HCL 1 MG/ML IJ SOLN
1.0000 mg | Freq: Once | INTRAMUSCULAR | Status: AC
  Administered 2018-07-03: 1 mg via INTRAVENOUS

## 2018-07-03 MED ORDER — HYDROMORPHONE HCL 1 MG/ML IJ SOLN: 1.0000 mg | Freq: Once | INTRAMUSCULAR | Status: DC

## 2018-07-03 MED ORDER — HYDROMORPHONE HCL 1 MG/ML IJ SOLN
1.0000 mg | Freq: Once | INTRAMUSCULAR | Status: DC
  Filled 2018-07-03: qty 1

## 2018-07-03 MED ORDER — HYDROMORPHONE HCL 1 MG/ML IJ SOLN
1.0000 mg | Freq: Once | INTRAMUSCULAR | Status: AC
  Administered 2018-07-03: 1 mg via INTRAVENOUS
  Filled 2018-07-03: qty 1

## 2018-07-03 NOTE — ED Triage Notes (Signed)
Pt arrives via ems from home. ems states pt was going up the steps when sandal caught stairs causing pt right knee to hyperextend. Pt a&o x 4 on arrival. Pulse and present with good cap refill. Pt unable to move right leg at this time

## 2018-07-04 ENCOUNTER — Other Ambulatory Visit: Payer: Self-pay

## 2018-07-04 DIAGNOSIS — G4733 Obstructive sleep apnea (adult) (pediatric): Secondary | ICD-10-CM | POA: Diagnosis present

## 2018-07-04 DIAGNOSIS — Z7984 Long term (current) use of oral hypoglycemic drugs: Secondary | ICD-10-CM | POA: Diagnosis not present

## 2018-07-04 DIAGNOSIS — N183 Chronic kidney disease, stage 3 (moderate): Secondary | ICD-10-CM | POA: Diagnosis present

## 2018-07-04 DIAGNOSIS — E669 Obesity, unspecified: Secondary | ICD-10-CM | POA: Diagnosis present

## 2018-07-04 DIAGNOSIS — K219 Gastro-esophageal reflux disease without esophagitis: Secondary | ICD-10-CM | POA: Diagnosis present

## 2018-07-04 DIAGNOSIS — R52 Pain, unspecified: Secondary | ICD-10-CM | POA: Diagnosis present

## 2018-07-04 DIAGNOSIS — Z8249 Family history of ischemic heart disease and other diseases of the circulatory system: Secondary | ICD-10-CM | POA: Diagnosis not present

## 2018-07-04 DIAGNOSIS — Y92009 Unspecified place in unspecified non-institutional (private) residence as the place of occurrence of the external cause: Secondary | ICD-10-CM | POA: Diagnosis not present

## 2018-07-04 DIAGNOSIS — S82143A Displaced bicondylar fracture of unspecified tibia, initial encounter for closed fracture: Secondary | ICD-10-CM | POA: Diagnosis present

## 2018-07-04 DIAGNOSIS — G4730 Sleep apnea, unspecified: Secondary | ICD-10-CM | POA: Diagnosis present

## 2018-07-04 DIAGNOSIS — S82141A Displaced bicondylar fracture of right tibia, initial encounter for closed fracture: Secondary | ICD-10-CM | POA: Diagnosis present

## 2018-07-04 DIAGNOSIS — I129 Hypertensive chronic kidney disease with stage 1 through stage 4 chronic kidney disease, or unspecified chronic kidney disease: Secondary | ICD-10-CM | POA: Diagnosis present

## 2018-07-04 DIAGNOSIS — W109XXA Fall (on) (from) unspecified stairs and steps, initial encounter: Secondary | ICD-10-CM | POA: Diagnosis present

## 2018-07-04 DIAGNOSIS — Z6841 Body Mass Index (BMI) 40.0 and over, adult: Secondary | ICD-10-CM | POA: Diagnosis not present

## 2018-07-04 DIAGNOSIS — Z91040 Latex allergy status: Secondary | ICD-10-CM | POA: Diagnosis not present

## 2018-07-04 DIAGNOSIS — F17210 Nicotine dependence, cigarettes, uncomplicated: Secondary | ICD-10-CM | POA: Diagnosis present

## 2018-07-04 DIAGNOSIS — Z9049 Acquired absence of other specified parts of digestive tract: Secondary | ICD-10-CM | POA: Diagnosis not present

## 2018-07-04 DIAGNOSIS — Z809 Family history of malignant neoplasm, unspecified: Secondary | ICD-10-CM | POA: Diagnosis not present

## 2018-07-04 DIAGNOSIS — F419 Anxiety disorder, unspecified: Secondary | ICD-10-CM | POA: Diagnosis present

## 2018-07-04 DIAGNOSIS — Z823 Family history of stroke: Secondary | ICD-10-CM | POA: Diagnosis not present

## 2018-07-04 DIAGNOSIS — E1122 Type 2 diabetes mellitus with diabetic chronic kidney disease: Secondary | ICD-10-CM | POA: Diagnosis present

## 2018-07-04 DIAGNOSIS — F329 Major depressive disorder, single episode, unspecified: Secondary | ICD-10-CM | POA: Diagnosis present

## 2018-07-04 LAB — CBC
HEMATOCRIT: 35.8 % (ref 35.0–47.0)
Hemoglobin: 12.4 g/dL (ref 12.0–16.0)
MCH: 31.6 pg (ref 26.0–34.0)
MCHC: 34.6 g/dL (ref 32.0–36.0)
MCV: 91.3 fL (ref 80.0–100.0)
Platelets: 295 10*3/uL (ref 150–440)
RBC: 3.93 MIL/uL (ref 3.80–5.20)
RDW: 15.6 % — ABNORMAL HIGH (ref 11.5–14.5)
WBC: 8.1 10*3/uL (ref 3.6–11.0)

## 2018-07-04 LAB — HEMOGLOBIN A1C
Hgb A1c MFr Bld: 6.8 % — ABNORMAL HIGH (ref 4.8–5.6)
MEAN PLASMA GLUCOSE: 148.46 mg/dL

## 2018-07-04 LAB — BASIC METABOLIC PANEL
Anion gap: 9 (ref 5–15)
BUN: 23 mg/dL — ABNORMAL HIGH (ref 6–20)
CALCIUM: 9 mg/dL (ref 8.9–10.3)
CO2: 27 mmol/L (ref 22–32)
Chloride: 101 mmol/L (ref 98–111)
Creatinine, Ser: 1.32 mg/dL — ABNORMAL HIGH (ref 0.44–1.00)
GFR calc non Af Amer: 43 mL/min — ABNORMAL LOW (ref >60)
GFR, EST AFRICAN AMERICAN: 50 mL/min — AB (ref >60)
Glucose, Bld: 108 mg/dL — ABNORMAL HIGH (ref 70–99)
Potassium: 4 mmol/L (ref 3.5–5.1)
SODIUM: 137 mmol/L (ref 135–145)

## 2018-07-04 LAB — GLUCOSE, CAPILLARY
GLUCOSE-CAPILLARY: 95 mg/dL (ref 70–99)
Glucose-Capillary: 185 mg/dL — ABNORMAL HIGH (ref 70–99)
Glucose-Capillary: 136 mg/dL — ABNORMAL HIGH (ref 70–99)
Glucose-Capillary: 180 mg/dL — ABNORMAL HIGH (ref 70–99)

## 2018-07-04 MED ORDER — VITAMIN D 1000 UNITS PO TABS
2000.0000 [IU] | ORAL_TABLET | Freq: Every day | ORAL | Status: DC
  Administered 2018-07-04 – 2018-07-08 (×4): 2000 [IU] via ORAL
  Filled 2018-07-04 (×4): qty 2

## 2018-07-04 MED ORDER — PANTOPRAZOLE SODIUM 40 MG PO TBEC
40.0000 mg | DELAYED_RELEASE_TABLET | Freq: Every day | ORAL | Status: DC
  Administered 2018-07-04 – 2018-07-08 (×4): 40 mg via ORAL
  Filled 2018-07-04 (×4): qty 1

## 2018-07-04 MED ORDER — ESCITALOPRAM OXALATE 10 MG PO TABS
20.0000 mg | ORAL_TABLET | Freq: Every day | ORAL | Status: DC
  Administered 2018-07-04 – 2018-07-08 (×4): 20 mg via ORAL
  Filled 2018-07-04 (×5): qty 2

## 2018-07-04 MED ORDER — LISINOPRIL-HYDROCHLOROTHIAZIDE 20-12.5 MG PO TABS: 1.0000 | ORAL_TABLET | Freq: Every day | ORAL | Status: DC

## 2018-07-04 MED ORDER — TRAZODONE HCL 50 MG PO TABS: 25.0000 mg | ORAL_TABLET | Freq: Every evening | ORAL | Status: DC | PRN

## 2018-07-04 MED ORDER — HYDROCODONE-ACETAMINOPHEN 5-325 MG PO TABS
1.0000 | ORAL_TABLET | ORAL | Status: DC | PRN
  Administered 2018-07-04 – 2018-07-05 (×5): 2 via ORAL
  Administered 2018-07-05 (×2): 1 via ORAL
  Administered 2018-07-05: 09:00:00 2 via ORAL
  Administered 2018-07-06: 1 via ORAL
  Administered 2018-07-06: 2 via ORAL
  Filled 2018-07-04: qty 1
  Filled 2018-07-04: qty 2
  Filled 2018-07-04: qty 1
  Filled 2018-07-04 (×4): qty 2
  Filled 2018-07-04: qty 1
  Filled 2018-07-04 (×4): qty 2

## 2018-07-04 MED ORDER — HYDROCHLOROTHIAZIDE 12.5 MG PO CAPS
12.5000 mg | ORAL_CAPSULE | Freq: Every day | ORAL | Status: DC
  Administered 2018-07-04 – 2018-07-08 (×4): 12.5 mg via ORAL
  Filled 2018-07-04 (×4): qty 1

## 2018-07-04 MED ORDER — ONDANSETRON HCL 4 MG PO TABS: 4.0000 mg | ORAL_TABLET | Freq: Four times a day (QID) | ORAL | Status: DC | PRN

## 2018-07-04 MED ORDER — HYDROMORPHONE HCL 1 MG/ML IJ SOLN
2.0000 mg | INTRAMUSCULAR | Status: DC | PRN
  Administered 2018-07-04 – 2018-07-07 (×10): 2 mg via INTRAVENOUS
  Filled 2018-07-04 (×11): qty 2

## 2018-07-04 MED ORDER — INSULIN ASPART 100 UNIT/ML ~~LOC~~ SOLN
0.0000 [IU] | Freq: Three times a day (TID) | SUBCUTANEOUS | Status: DC
  Administered 2018-07-04: 3 [IU] via SUBCUTANEOUS
  Administered 2018-07-04 – 2018-07-05 (×2): 2 [IU] via SUBCUTANEOUS
  Administered 2018-07-06: 3 [IU] via SUBCUTANEOUS
  Administered 2018-07-07: 2 [IU] via SUBCUTANEOUS
  Administered 2018-07-07 – 2018-07-08 (×2): 3 [IU] via SUBCUTANEOUS
  Filled 2018-07-04 (×7): qty 1

## 2018-07-04 MED ORDER — LINAGLIPTIN 5 MG PO TABS
5.0000 mg | ORAL_TABLET | Freq: Every day | ORAL | Status: DC
  Administered 2018-07-04 – 2018-07-08 (×4): 5 mg via ORAL
  Filled 2018-07-04 (×5): qty 1

## 2018-07-04 MED ORDER — INSULIN ASPART 100 UNIT/ML ~~LOC~~ SOLN
0.0000 [IU] | Freq: Every day | SUBCUTANEOUS | Status: DC
  Administered 2018-07-06: 0 [IU] via SUBCUTANEOUS

## 2018-07-04 MED ORDER — ACETAMINOPHEN 650 MG RE SUPP: 650.0000 mg | Freq: Four times a day (QID) | RECTAL | Status: DC | PRN

## 2018-07-04 MED ORDER — HEPARIN SODIUM (PORCINE) 5000 UNIT/ML IJ SOLN
5000.0000 [IU] | Freq: Three times a day (TID) | INTRAMUSCULAR | Status: DC
  Administered 2018-07-04 – 2018-07-08 (×11): 5000 [IU] via SUBCUTANEOUS
  Filled 2018-07-04 (×11): qty 1

## 2018-07-04 MED ORDER — MUPIROCIN 2 % EX OINT
1.0000 "application " | TOPICAL_OINTMENT | Freq: Two times a day (BID) | CUTANEOUS | Status: DC
  Administered 2018-07-04 – 2018-07-06 (×2): 1 via NASAL
  Filled 2018-07-04: qty 22

## 2018-07-04 MED ORDER — LISINOPRIL 20 MG PO TABS
20.0000 mg | ORAL_TABLET | Freq: Every day | ORAL | Status: DC
  Administered 2018-07-04 – 2018-07-08 (×4): 20 mg via ORAL
  Filled 2018-07-04 (×4): qty 1

## 2018-07-04 MED ORDER — DOCUSATE SODIUM 100 MG PO CAPS
100.0000 mg | ORAL_CAPSULE | Freq: Two times a day (BID) | ORAL | Status: DC
  Administered 2018-07-04 – 2018-07-06 (×4): 100 mg via ORAL
  Filled 2018-07-04 (×4): qty 1

## 2018-07-04 MED ORDER — HYDROMORPHONE HCL 1 MG/ML IJ SOLN
1.0000 mg | INTRAMUSCULAR | Status: DC | PRN
  Administered 2018-07-04 (×2): 1 mg via INTRAVENOUS
  Filled 2018-07-04 (×2): qty 1

## 2018-07-04 MED ORDER — BISACODYL 5 MG PO TBEC
5.0000 mg | DELAYED_RELEASE_TABLET | Freq: Every day | ORAL | Status: DC | PRN
  Administered 2018-07-06: 5 mg via ORAL
  Filled 2018-07-04: qty 1

## 2018-07-04 MED ORDER — ACETAMINOPHEN 325 MG PO TABS: 650.0000 mg | ORAL_TABLET | Freq: Four times a day (QID) | ORAL | Status: DC | PRN

## 2018-07-04 MED ORDER — SODIUM CHLORIDE 0.9 % IV SOLN
INTRAVENOUS | Status: DC
  Administered 2018-07-04 – 2018-07-05 (×5): via INTRAVENOUS

## 2018-07-04 MED ORDER — ONDANSETRON HCL 4 MG/2ML IJ SOLN: 4.0000 mg | Freq: Four times a day (QID) | INTRAMUSCULAR | Status: DC | PRN

## 2018-07-04 NOTE — Plan of Care (Signed)
  Problem: Safety: Goal: Ability to remain free from injury will improve Outcome: Progressing   Problem: Pain Managment: Goal: General experience of comfort will improve Outcome: Progressing   

## 2018-07-04 NOTE — Consult Note (Signed)
ORTHOPAEDIC CONSULTATION  REQUESTING PHYSICIAN: Enid Baas, MD  Chief Complaint: right knee pain  HPI: Alyssa Maynard is a 57 y.o. female who complains of right knee pain after a fall. Please see ED and H&P for details. She denies any numbness, tingling or constitutional symptoms.  Past Medical History:  Diagnosis Date  . Anxiety   . Chronic kidney disease 04/2017   stage 3  . Complication of anesthesia    pt experiences difficulty breathing after anesthesia due to sleep apnea and smoking  . Depression   . Diabetes mellitus without complication (HCC)   . Dyspnea   . GERD (gastroesophageal reflux disease)   . Hypertension   . Sleep apnea    Past Surgical History:  Procedure Laterality Date  . APPENDECTOMY    . CAROTID ANGIOGRAPHY N/A 04/27/2017   Procedure: Carotid Angiography;  Surgeon: Annice Needy, MD;  Location: [facility] INVASIVE CV LAB;  Service: Cardiovascular;  Laterality: N/A;  . CHOLECYSTECTOMY    . FOOT SURGERY Left   . HAND SURGERY Right   . HYSTEROSCOPY W/D&C N/A 11/02/2015   Procedure: DILATATION AND CURETTAGE /HYSTEROSCOPY;  Surgeon: Ala Dach, MD;  Location: [facility] ORS;  Service: Gynecology;  Laterality: N/A;  . HYSTEROSCOPY W/D&C    . LIVER SURGERY    . TONSILLECTOMY    . UPPER GI ENDOSCOPY     Social History   Socioeconomic History  . Marital status: Married    Spouse name: Not on file  . Number of children: Not on file  . Years of education: Not on file  . Highest education level: Not on file  Occupational History  . Not on file  Social Needs  . Financial resource strain: Not on file  . Food insecurity:    Worry: Not on file    Inability: Not on file  . Transportation needs:    Medical: Not on file    Non-medical: Not on file  Tobacco Use  . Smoking status: Current Every Day Smoker    Packs/day: 1.00  . Smokeless tobacco: Never Used  Substance and Sexual Activity  . Alcohol use: Yes    Alcohol/week: 14.0 standard drinks    Types:  14 Shots of liquor per week  . Drug use: No  . Sexual activity: Not on file  Lifestyle  . Physical activity:    Days per week: Not on file    Minutes per session: Not on file  . Stress: Not on file  Relationships  . Social connections:    Talks on phone: Not on file    Gets together: Not on file    Attends religious service: Not on file    Active member of club or organization: Not on file    Attends meetings of clubs or organizations: Not on file    Relationship status: Not on file  Other Topics Concern  . Not on file  Social History Narrative  . Not on file   Family History  Problem Relation Age of Onset  . Heart attack Mother   . Stroke Mother   . Heart attack Father   . Cancer Father    Allergies  Allergen Reactions  . Latex Rash and Other (See Comments)    Burns skin    Prior to Admission medications   Medication Sig Start Date End Date Taking? Authorizing Provider  acetaminophen-codeine (TYLENOL #3) 300-30 MG tablet Take 1-2 tablets every 8 (eight) hours as needed by mouth for moderate pain. 09/21/17  Yes Kathryne Hitch, MD  cholecalciferol (VITAMIN D) 1000 units tablet Take 2,000 Units by mouth daily.   Yes [provider]  escitalopram (LEXAPRO) 20 MG tablet Take 20 mg by mouth daily.   Yes [provider]  esomeprazole (NEXIUM) 40 MG capsule Take 40 mg by mouth daily.   Yes [provider]  glimepiride (AMARYL) 4 MG tablet Take 4 mg by mouth daily.  03/20/17  Yes [provider]  glucose blood (ONE TOUCH ULTRA TEST) test strip TEST once daily 03/17/17  Yes [provider]  lisinopril-hydrochlorothiazide (PRINZIDE,ZESTORETIC) 20-12.5 MG tablet Take 1 tablet by mouth daily.    Yes [provider]  pioglitazone (ACTOS) 30 MG tablet Take 1 tablet by mouth daily. 06/07/18  Yes [provider]  sitaGLIPtin (JANUVIA) 50 MG tablet Take 50 mg by mouth daily.  02/21/17 07/04/18 Yes [provider]   tiZANidine (ZANAFLEX) 2 MG tablet 1-2 tablets at bedtime as needed for muscle spasm 05/25/17   [provider]  traMADol (ULTRAM) 50 MG tablet Take 50 mg by mouth.  05/25/17   [provider]   Ct Knee Right Wo Contrast  Result Date: 07/04/2018 CLINICAL DATA:  57 year old female with fall and fracture of the tibial plateau. EXAM: CT OF THE right KNEE WITHOUT CONTRAST TECHNIQUE: Multidetector CT imaging of the right knee was performed according to the standard protocol. Multiplanar CT image reconstructions were also generated. COMPARISON:  Radiograph of the right knee dated 07/03/2018 FINDINGS: Bones/Joint/Cartilage There is a comminuted, intra-articular fracture of the lateral tibial plateau. There is a depressed central fracture fragment and lateral displaced fragment with approximately 1 cm distraction gap. There is no dislocation. There is a small suprapatellar hemarthrosis. Ligaments Suboptimally assessed by CT. Muscles and Tendons There is diffuse stranding of the medial and lateral patellofemoral ligaments. Soft tissues There is stranding of the soft tissues of the posterior knee and popliteal fossa. No fluid collection or large hematoma. IMPRESSION: Comminuted intra-articular and displaced fracture of the lateral tibial plateau. Electronically Signed   By: Elgie Collard M.D.   On: 07/04/2018 00:20   Dg Knee Complete 4 Views Right  Result Date: 07/03/2018 CLINICAL DATA:  C/o pain after flipflop got caught on step and fell backwards today EXAM: RIGHT KNEE - COMPLETE 4+ VIEW COMPARISON:  None. FINDINGS: There is an acute comminuted fracture of the LATERAL tibial plateau, associated with approximately 1 centimeter of LATERAL displacement. The MEDIAL plateau is intact. IMPRESSION: Acute fracture of the LATERAL tibial plateau. Electronically Signed   By: Norva Pavlov M.D.   On: 07/03/2018 21:53    Positive ROS: All other systems have been reviewed and were otherwise negative with  the exception of those mentioned in the HPI and as above.  Physical Exam: General: Alert, no acute distress Cardiovascular: No pedal edema Respiratory: No cyanosis, no use of accessory musculature GI: No organomegaly, abdomen is soft and non-tender Skin: No lesions in the area of chief complaint Neurologic: Sensation intact distally Psychiatric: Patient is competent for consent with normal mood and affect Lymphatic: No axillary or cervical lymphadenopathy  MUSCULOSKELETAL: right knee with moderate knee effusion, tenderness medial and lateral, good cap refill, compartments soft, DF/PF/EHL intact, sensation light touch intact  Assessment: Right tibial plateau fracture, closed displaced  Plan: Plan an open reduction and internal fixation of the right proximal tibia tomorrow morning.  The diagnosis, risks, benefits and alternatives to treatment are all discussed in detail with the patient and family. Risks include  but are not limited to bleeding, infection, deep vein thrombosis, pulmonary embolism, nerve or vascular injury, non-union, repeat operation, persistent pain, weakness, stiffness and death. She understands and is eager to proceed.     Lyndle Herrlich, MD    07/04/2018 12:36 PM

## 2018-07-04 NOTE — ED Notes (Signed)
Pt resting on stretcher with eyes closed and even respirations. No distress noted.  

## 2018-07-04 NOTE — ED Provider Notes (Signed)
[facility] Emergency Department Provider Note  ____________________________________________  Time seen: Approximately 12:48 AM  I have reviewed the triage vital signs and the nursing notes.   HISTORY  Chief Complaint Fall    HPI Alyssa Maynard is a 57 y.o. female with a history of anxiety, CKD, hypertension, GERD, depression, diabetes who complains of right knee pain that started suddenly and severely after falling down the steps today.  Unable to bear weight.  Nonradiating.  Worse with any movement.  No alleviating factors.  Denies any other injuries.      Past Medical History:  Diagnosis Date  . Anxiety   . Chronic kidney disease 04/2017   stage 3  . Complication of anesthesia    pt experiences difficulty breathing after anesthesia due to sleep apnea and smoking  . Depression   . Diabetes mellitus without complication (HCC)   . Dyspnea   . GERD (gastroesophageal reflux disease)   . Hypertension   . Sleep apnea      Patient Active Problem List   Diagnosis Date Noted  . Acute back pain with sciatica, left 07/13/2017  . Lumbar radiculopathy 05/31/2017  . Type 2 diabetes mellitus with stage 3 chronic kidney disease, without long-term current use of insulin (HCC) 04/07/2017  . Essential hypertension 04/07/2017  . Dizziness and giddiness 04/07/2017  . Prolonged Q-T interval on ECG 02/17/2017  . CKD (chronic kidney disease) stage 3, GFR 30-59 ml/min (HCC) 08/12/2016  . Mixed anxiety and depressive disorder 04/26/2015  . Gastroesophageal reflux disease without esophagitis 04/26/2015  . Tobacco dependence 06/07/2014  . OSA (obstructive sleep apnea) 06/07/2014  . HTN (hypertension) 06/07/2014  . Depression 06/07/2014     Past Surgical History:  Procedure Laterality Date  . APPENDECTOMY    . CAROTID ANGIOGRAPHY N/A 04/27/2017   Procedure: Carotid Angiography;  Surgeon: Annice Needy, MD;  Location: [facility] INVASIVE CV LAB;  Service: Cardiovascular;   Laterality: N/A;  . CHOLECYSTECTOMY    . FOOT SURGERY Left   . HAND SURGERY Right   . HYSTEROSCOPY W/D&C N/A 11/02/2015   Procedure: DILATATION AND CURETTAGE /HYSTEROSCOPY;  Surgeon: Ala Dach, MD;  Location: [facility] ORS;  Service: Gynecology;  Laterality: N/A;  . HYSTEROSCOPY W/D&C    . LIVER SURGERY    . TONSILLECTOMY    . UPPER GI ENDOSCOPY       Prior to Admission medications   Medication Sig Start Date End Date Taking? Authorizing Provider  acetaminophen-codeine (TYLENOL #3) 300-30 MG tablet Take 1-2 tablets every 8 (eight) hours as needed by mouth for moderate pain. 09/21/17   Kathryne Hitch, MD  escitalopram (LEXAPRO) 20 MG tablet Take 20 mg by mouth daily.    [provider]  esomeprazole (NEXIUM) 40 MG capsule Take 40 mg by mouth daily.    [provider]  glimepiride (AMARYL) 4 MG tablet Take 4 mg by mouth daily.  03/20/17   [provider]  glucose blood (ONE TOUCH ULTRA TEST) test strip TEST once daily 03/17/17   [provider]  lisinopril-hydrochlorothiazide (PRINZIDE,ZESTORETIC) 20-12.5 MG tablet Take 1 tablet by mouth 2 (two) times daily.    [provider]  sitaGLIPtin (JANUVIA) 50 MG tablet Take 50 mg by mouth daily.  02/21/17 02/21/18  [provider]  tiZANidine (ZANAFLEX) 2 MG tablet 1-2 tablets at bedtime as needed for muscle spasm 05/25/17   [provider]  traMADol Janean Sark) 50 MG tablet Take by mouth. 05/25/17   [provider]  Allergies Latex   Family History  Problem Relation Age of Onset  . Heart attack Mother   . Stroke Mother   . Heart attack Father   . Cancer Father     Social History Social History   Tobacco Use  . Smoking status: Current Every Day Smoker    Packs/day: 1.00  . Smokeless tobacco: Never Used  Substance Use Topics  . Alcohol use: Yes    Alcohol/week: 14.0 standard drinks    Types: 14 Shots of liquor per week  . Drug use: No    Review of  Systems  Constitutional:   No fever or chills.  ENT:   No sore throat. No rhinorrhea. Cardiovascular:   No chest pain or syncope. Respiratory:   No dyspnea or cough. Gastrointestinal:   Negative for abdominal pain, vomiting and diarrhea.  Musculoskeletal:   Right knee pain as above All other systems reviewed and are negative except as documented above in ROS and HPI.  ____________________________________________   PHYSICAL EXAM:  VITAL SIGNS: ED Triage Vitals  Enc Vitals Group     BP 07/03/18 2050 (!) 147/108     Pulse Rate 07/03/18 2056 98     Resp 07/03/18 2111 17     Temp 07/03/18 2056 98.4 F (36.9 C)     Temp Source 07/03/18 2056 Oral     SpO2 07/03/18 2033 94 %     Weight 07/03/18 2059 250 lb (113.4 kg)     Height 07/03/18 2059 5\' 5"  (1.651 m)     Head Circumference --      Peak Flow --      Pain Score 07/03/18 2058 10     Pain Loc --      Pain Edu? --      Excl. in GC? --     Vital signs reviewed, nursing assessments reviewed.   Constitutional:   Alert and oriented. Non-toxic appearance.  Severe pain Eyes:   Conjunctivae are normal. EOMI. PERRL. ENT      Head:   Normocephalic and atraumatic.      Nose:   No congestion/rhinnorhea.       Mouth/Throat:   MMM, no pharyngeal erythema. No peritonsillar mass.       Neck:   No meningismus. Full ROM. Hematological/Lymphatic/Immunilogical:   No cervical lymphadenopathy. Cardiovascular:   RRR. Symmetric bilateral radial and DP pulses.  No murmurs. Cap refill less than 2 seconds. Respiratory:   Normal respiratory effort without tachypnea/retractions. Breath sounds are clear and equal bilaterally. No wheezes/rales/rhonchi. Gastrointestinal:   Soft and nontender. Non distended. There is no CVA tenderness.  No rebound, rigidity, or guarding. Genitourinary:   deferred Musculoskeletal: Pain limited range of motion in the right knee.  There is a right knee effusion.  There is abrasion over the right prepatellar space.  No  lacerations.  Knee joint is stable.  Tenderness at the joint line.. Neurologic:   Normal speech and language.  Motor grossly intact. No acute focal neurologic deficits are appreciated.  Skin:    Skin is warm, dry and intact. No rash noted.  No petechiae, purpura, or bullae.  ____________________________________________    LABS (pertinent positives/negatives) (all labs ordered are listed, but only abnormal results are displayed) Labs Reviewed  BASIC METABOLIC PANEL - Abnormal; Notable for the following components:      Result Value   Potassium 3.2 (*)    Glucose, Bld 106 (*)    Creatinine, Ser 1.45 (*)    GFR calc non  Af Amer 39 (*)    GFR calc Af Amer 45 (*)    All other components within normal limits   ____________________________________________   EKG    ____________________________________________    RADIOLOGY  Ct Knee Right Wo Contrast  Result Date: 07/04/2018 CLINICAL DATA:  57 year old female with fall and fracture of the tibial plateau. EXAM: CT OF THE right KNEE WITHOUT CONTRAST TECHNIQUE: Multidetector CT imaging of the right knee was performed according to the standard protocol. Multiplanar CT image reconstructions were also generated. COMPARISON:  Radiograph of the right knee dated 07/03/2018 FINDINGS: Bones/Joint/Cartilage There is a comminuted, intra-articular fracture of the lateral tibial plateau. There is a depressed central fracture fragment and lateral displaced fragment with approximately 1 cm distraction gap. There is no dislocation. There is a small suprapatellar hemarthrosis. Ligaments Suboptimally assessed by CT. Muscles and Tendons There is diffuse stranding of the medial and lateral patellofemoral ligaments. Soft tissues There is stranding of the soft tissues of the posterior knee and popliteal fossa. No fluid collection or large hematoma. IMPRESSION: Comminuted intra-articular and displaced fracture of the lateral tibial plateau. Electronically Signed    By: Elgie Collard M.D.   On: 07/04/2018 00:20   Dg Knee Complete 4 Views Right  Result Date: 07/03/2018 CLINICAL DATA:  C/o pain after flipflop got caught on step and fell backwards today EXAM: RIGHT KNEE - COMPLETE 4+ VIEW COMPARISON:  None. FINDINGS: There is an acute comminuted fracture of the LATERAL tibial plateau, associated with approximately 1 centimeter of LATERAL displacement. The MEDIAL plateau is intact. IMPRESSION: Acute fracture of the LATERAL tibial plateau. Electronically Signed   By: Norva Pavlov M.D.   On: 07/03/2018 21:53    ____________________________________________   PROCEDURES Procedures  ____________________________________________  DIFFERENTIAL DIAGNOSIS   Knee fracture, ligament disruption.  Doubt transient knee dislocation  CLINICAL IMPRESSION / ASSESSMENT AND PLAN / ED COURSE  Pertinent labs & imaging results that were available during my care of the patient were reviewed by me and considered in my medical decision making (see chart for details).    Patient presents with severe right knee pain after a fall and reported hyperextension injury.  Severe tenderness at the knee joint.  X-ray performed which does show a lateral tibial plateau fracture.  Discussed with Ortho Dr. Odis Luster who recommends knee immobilizer and outpatient follow-up in 3 days if pain can be controlled.  However on reassessment patient still having severe pain after 2 doses of 1mg  IV Dilaudid.  She is adamant that she will be unable to tolerate crutches or even getting into her house.  Ordered a dose of 15mg  IV Toradol as well as 34mg  IV ketamine.  Discussed with hospitalist for further management of intractable pain.      ____________________________________________   FINAL CLINICAL IMPRESSION(S) / ED DIAGNOSES    Final diagnoses:  Closed fracture of lateral portion of right tibial plateau, initial encounter  Intractable pain     ED Discharge Orders    None       Portions of this note were generated with dragon dictation software. Dictation errors may occur despite best attempts at proofreading.    Sharman Cheek, MD 07/04/18 [id number]

## 2018-07-04 NOTE — Progress Notes (Signed)
Patient resting in the bed at this time, vss, PRN pain med administer as per request, right knee immobilizer in place, patient is NPO after midnight  For procedure

## 2018-07-04 NOTE — Plan of Care (Signed)

## 2018-07-04 NOTE — Progress Notes (Signed)
[facility] - [place] at [facility]   PATIENT NAME: Alyssa Maynard    MR#:  [id number]  DATE OF BIRTH:  [birth year]  SUBJECTIVE:  CHIEF COMPLAINT:   Chief Complaint  Patient presents with  . Fall   -Patient came in with fall and right tibial plateau fracture.  Pain is still persistent.  Awaiting Ortho input  REVIEW OF SYSTEMS:  Review of Systems  Constitutional: Negative for chills and fever.  HENT: Negative for congestion, ear discharge, hearing loss and nosebleeds.   Eyes: Negative for blurred vision and double vision.  Respiratory: Negative for cough, shortness of breath and wheezing.   Cardiovascular: Negative for chest pain and palpitations.  Gastrointestinal: Negative for abdominal pain, constipation, diarrhea, nausea and vomiting.  Genitourinary: Negative for dysuria.  Musculoskeletal: Positive for joint pain and myalgias.  Skin: Negative for rash.  Neurological: Negative for dizziness, focal weakness, seizures, weakness and headaches.  Psychiatric/Behavioral: Negative for depression.    DRUG ALLERGIES:   Allergies  Allergen Reactions  . Latex Rash and Other (See Comments)    Burns skin     VITALS:  Blood pressure 137/83, pulse 74, temperature 98.1 F (36.7 C), temperature source Oral, resp. rate 18, height 5\' 5"  (1.651 m), weight 121 kg, last menstrual period 08/27/2015, SpO2 96 %.  PHYSICAL EXAMINATION:  Physical Exam  GENERAL:  57 y.o.-year-old obese patient lying in the bed with no acute distress.  EYES: Pupils equal, round, reactive to light and accommodation. No scleral icterus. Extraocular muscles intact.  HEENT: Head atraumatic, normocephalic. Oropharynx and nasopharynx clear.  NECK:  Supple, no jugular venous distention. No thyroid enlargement, no tenderness.  LUNGS: Normal breath sounds bilaterally, no wheezing, rales,rhonchi or crepitation. No use of accessory muscles of respiration.  Decreased bibasilar breath sounds CARDIOVASCULAR:  S1, S2 normal. No murmurs, rubs, or gallops.  ABDOMEN: Soft, nontender, nondistended. Bowel sounds present. No organomegaly or mass.  EXTREMITIES: Right leg is immobilized.  No pedal edema, cyanosis, or clubbing.  NEUROLOGIC: Cranial nerves II through XII are intact. Muscle strength 5/5 in all extremities. Sensation intact. Gait not checked.  PSYCHIATRIC: The patient is alert and oriented x 3.  SKIN: No obvious rash, lesion, or ulcer.    LABORATORY PANEL:   CBC Recent Labs  Lab 07/04/18 0608  WBC 8.1  HGB 12.4  HCT 35.8  PLT 295   ------------------------------------------------------------------------------------------------------------------  Chemistries  Recent Labs  Lab 07/04/18 0608  NA 137  K 4.0  CL 101  CO2 27  GLUCOSE 108*  BUN 23*  CREATININE 1.32*  CALCIUM 9.0   ------------------------------------------------------------------------------------------------------------------  Cardiac Enzymes No results for input(s): TROPONINI in the last 168 hours. ------------------------------------------------------------------------------------------------------------------  RADIOLOGY:  Ct Knee Right Wo Contrast  Result Date: 07/04/2018 CLINICAL DATA:  57 year old female with fall and fracture of the tibial plateau. EXAM: CT OF THE right KNEE WITHOUT CONTRAST TECHNIQUE: Multidetector CT imaging of the right knee was performed according to the standard protocol. Multiplanar CT image reconstructions were also generated. COMPARISON:  Radiograph of the right knee dated 07/03/2018 FINDINGS: Bones/Joint/Cartilage There is a comminuted, intra-articular fracture of the lateral tibial plateau. There is a depressed central fracture fragment and lateral displaced fragment with approximately 1 cm distraction gap. There is no dislocation. There is a small suprapatellar hemarthrosis. Ligaments Suboptimally assessed by CT. Muscles and Tendons There is diffuse stranding of the medial and  lateral patellofemoral ligaments. Soft tissues There is stranding of the soft tissues of the posterior knee and popliteal fossa. No  fluid collection or large hematoma. IMPRESSION: Comminuted intra-articular and displaced fracture of the lateral tibial plateau. Electronically Signed   By: Elgie Collard M.D.   On: 07/04/2018 00:20   Dg Knee Complete 4 Views Right  Result Date: 07/03/2018 CLINICAL DATA:  C/o pain after flipflop got caught on step and fell backwards today EXAM: RIGHT KNEE - COMPLETE 4+ VIEW COMPARISON:  None. FINDINGS: There is an acute comminuted fracture of the LATERAL tibial plateau, associated with approximately 1 centimeter of LATERAL displacement. The MEDIAL plateau is intact. IMPRESSION: Acute fracture of the LATERAL tibial plateau. Electronically Signed   By: Norva Pavlov M.D.   On: 07/03/2018 21:53    EKG:   Orders placed or performed during the hospital encounter of 10/23/15  . EKG 12 lead  . EKG 12 lead    ASSESSMENT AND PLAN:   57 year old female with past medical history significant for diabetes, CKD stage III, hypertension sleep apnea presents to hospital secondary to mechanical fall and right knee pain  1.  Right tibial plateau fracture-CT showing comminuted intra-articular and displaced fracture of right tibial plateau. -Ortho has been consulted.  Pain control -After Ortho evaluation, patient will need physical therapy consult based on weightbearing status  2.  Diabetes mellitus-patient on Januvia, Amaryl and Actos at home Will restart her home meds based on her sugars here.  Currently on sliding scale insulin  3.  Hypertension-on lisinopril and hydrochlorothiazide   4.  Depression and anxiety-continue Celexa  5.  DVT prophylaxis-subcu heparin  6.  CKD stage III-stable.  Monitor    All the records are reviewed and case discussed with Care Management/Social Workerr. Management plans discussed with the patient, family and they are in  agreement.  CODE STATUS: Full code  TOTAL TIME TAKING CARE OF THIS PATIENT: 37 minutes.   POSSIBLE D/C IN 1 to 2 DAYS, DEPENDING ON CLINICAL CONDITION.   Enid Baas M.D on 07/04/2018 at 9:18 AM  Between 7am to 6pm - Pager - [phone]  After 6pm go to [email or web address] - Social research officer, government  [facility]  Office  [phone]  CC: Primary care physician; Leotis Shames, MD

## 2018-07-04 NOTE — ED Notes (Signed)
Pt resting quietly with husband at the bedside. Requesting more pain medication. C/o pain 10/10.

## 2018-07-04 NOTE — Progress Notes (Signed)
Report given to Erwin, RN

## 2018-07-04 NOTE — ED Notes (Addendum)
Assisted pt with use of purewick. Changed bed sheets and provided for comfort and safety. Will continue to assess.

## 2018-07-04 NOTE — ED Notes (Signed)
Pt to CT

## 2018-07-04 NOTE — ED Notes (Signed)
Ice applied to affected leg and provided for pt comfort.

## 2018-07-04 NOTE — H&P (Signed)
[facility] [place] at [facility]   PATIENT NAME: Alyssa Maynard    MR#:  [id number]  DATE OF BIRTH:  1961/05/22  DATE OF ADMISSION:  07/03/2018  PRIMARY CARE PHYSICIAN: Leotis Shames, MD   REQUESTING/REFERRING PHYSICIAN:   CHIEF COMPLAINT:   Chief Complaint  Patient presents with  . Fall    HISTORY OF PRESENT ILLNESS: Alyssa Maynard  is a 57 y.o. female with a known history of diabetes type 2, CKD 3, hypertension, sleep apnea and anxiety disorder. Patient presented to emergency room for severe right knee pain status post mechanical fall at home.  She says she lost her balance and fell down the stairs from the back entrance to the house.  She landed on her right knee and she has been unable to bear weight on the right leg since the fall. Blood test done emergency room are notable for creatinine level of 1.45, which seems to be baseline for her. X-ray and CT scan of the right knee confirm comminuted intra-articular and displaced fracture of the lateral tibial plateau.  Patient is admitted for further evaluation and treatment.   PAST MEDICAL HISTORY:   Past Medical History:  Diagnosis Date  . Anxiety   . Chronic kidney disease 04/2017   stage 3  . Complication of anesthesia    pt experiences difficulty breathing after anesthesia due to sleep apnea and smoking  . Depression   . Diabetes mellitus without complication (HCC)   . Dyspnea   . GERD (gastroesophageal reflux disease)   . Hypertension   . Sleep apnea     PAST SURGICAL HISTORY:  Past Surgical History:  Procedure Laterality Date  . APPENDECTOMY    . CAROTID ANGIOGRAPHY N/A 04/27/2017   Procedure: Carotid Angiography;  Surgeon: Annice Needy, MD;  Location: [facility] INVASIVE CV LAB;  Service: Cardiovascular;  Laterality: N/A;  . CHOLECYSTECTOMY    . FOOT SURGERY Left   . HAND SURGERY Right   . HYSTEROSCOPY W/D&C N/A 11/02/2015   Procedure: DILATATION AND CURETTAGE /HYSTEROSCOPY;  Surgeon: Ala Dach, MD;  Location: [facility] ORS;  Service: Gynecology;  Laterality: N/A;  . HYSTEROSCOPY W/D&C    . LIVER SURGERY    . TONSILLECTOMY    . UPPER GI ENDOSCOPY      SOCIAL HISTORY:  Social History   Tobacco Use  . Smoking status: Current Every Day Smoker    Packs/day: 1.00  . Smokeless tobacco: Never Used  Substance Use Topics  . Alcohol use: Yes    Alcohol/week: 14.0 standard drinks    Types: 14 Shots of liquor per week    FAMILY HISTORY:  Family History  Problem Relation Age of Onset  . Heart attack Mother   . Stroke Mother   . Heart attack Father   . Cancer Father     DRUG ALLERGIES:  Allergies  Allergen Reactions  . Latex Rash and Other (See Comments)    Burns skin     REVIEW OF SYSTEMS:   CONSTITUTIONAL: No fever, fatigue or weakness.  EYES: No changes in vision.  EARS, NOSE, AND THROAT: No tinnitus or ear pain.  RESPIRATORY: No cough, shortness of breath, wheezing or hemoptysis.  CARDIOVASCULAR: No chest pain, orthopnea, edema.  GASTROINTESTINAL: No nausea, vomiting, diarrhea or abdominal pain.  GENITOURINARY: No dysuria, hematuria.  ENDOCRINE: No polyuria, nocturia. HEMATOLOGY: No bleeding. SKIN: No rash or lesion. MUSCULOSKELETAL: Right knee pain and swelling.   NEUROLOGIC: No focal weakness.  PSYCHIATRY: No anxiety or  depression.   MEDICATIONS AT HOME:  Prior to Admission medications   Medication Sig Start Date End Date Taking? Authorizing Provider  acetaminophen-codeine (TYLENOL #3) 300-30 MG tablet Take 1-2 tablets every 8 (eight) hours as needed by mouth for moderate pain. 09/21/17  Yes Kathryne Hitch, MD  cholecalciferol (VITAMIN D) 1000 units tablet Take 2,000 Units by mouth daily.   Yes [provider]  escitalopram (LEXAPRO) 20 MG tablet Take 20 mg by mouth daily.   Yes [provider]  esomeprazole (NEXIUM) 40 MG capsule Take 40 mg by mouth daily.   Yes [provider]  glimepiride (AMARYL) 4 MG tablet Take  4 mg by mouth daily.  03/20/17  Yes [provider]  glucose blood (ONE TOUCH ULTRA TEST) test strip TEST once daily 03/17/17  Yes [provider]  lisinopril-hydrochlorothiazide (PRINZIDE,ZESTORETIC) 20-12.5 MG tablet Take 1 tablet by mouth daily.    Yes [provider]  pioglitazone (ACTOS) 30 MG tablet Take 1 tablet by mouth daily. 06/07/18  Yes [provider]  sitaGLIPtin (JANUVIA) 50 MG tablet Take 50 mg by mouth daily.  02/21/17 07/04/18 Yes [provider]  tiZANidine (ZANAFLEX) 2 MG tablet 1-2 tablets at bedtime as needed for muscle spasm 05/25/17   [provider]  traMADol (ULTRAM) 50 MG tablet Take 50 mg by mouth.  05/25/17   [provider]      PHYSICAL EXAMINATION:   VITAL SIGNS: Blood pressure 130/75, pulse 80, temperature 98.4 F (36.9 C), temperature source Oral, resp. rate 16, height 5\' 5"  (1.651 m), weight 113.4 kg, last menstrual period 08/27/2015, SpO2 95 %.  GENERAL:  57 y.o.-year-old patient lying in the bed with no acute distress.  EYES: Pupils equal, round, reactive to light and accommodation. No scleral icterus. Extraocular muscles intact.  HEENT: Head atraumatic, normocephalic. Oropharynx and nasopharynx clear.  NECK:  Supple, no jugular venous distention. No thyroid enlargement, no tenderness.  LUNGS: Normal breath sounds bilaterally, no wheezing, rales,rhonchi or crepitation. No use of accessory muscles of respiration.  CARDIOVASCULAR: S1, S2 normal. No S3/S4.  ABDOMEN: Soft, nontender, nondistended. Bowel sounds present. No organomegaly or mass.  EXTREMITIES: There is reduced range of motion at the right knee joint secondary to pain and swelling.  An abrasion is noted over the right prepatellar space, no lacerations.  There is severe tenderness with palpation at the joint line. NEUROLOGIC: Cranial nerves II through XII are intact. Muscle strength 5/5 in all extremities. Sensation intact.   PSYCHIATRIC: The  patient is alert and oriented x 3.  SKIN: No obvious rash, lesion, or ulcer.   LABORATORY PANEL:   CBC No results for input(s): WBC, HGB, HCT, PLT, MCV, MCH, MCHC, RDW, LYMPHSABS, MONOABS, EOSABS, BASOSABS, BANDABS in the last 168 hours.  Invalid input(s): NEUTRABS, BANDSABD ------------------------------------------------------------------------------------------------------------------  Chemistries  Recent Labs  Lab 07/03/18 2045  NA 136  K 3.2*  CL 100  CO2 22  GLUCOSE 106*  BUN 19  CREATININE 1.45*  CALCIUM 9.5   ------------------------------------------------------------------------------------------------------------------ estimated creatinine clearance is 52.5 mL/min (A) (by C-G formula based on SCr of 1.45 mg/dL (H)). ------------------------------------------------------------------------------------------------------------------ No results for input(s): TSH, T4TOTAL, T3FREE, THYROIDAB in the last 72 hours.  Invalid input(s): FREET3   Coagulation profile No results for input(s): INR, PROTIME in the last 168 hours. ------------------------------------------------------------------------------------------------------------------- No results for input(s): DDIMER in the last 72 hours. -------------------------------------------------------------------------------------------------------------------  Cardiac Enzymes No results for input(s): CKMB, TROPONINI, MYOGLOBIN in the last 168 hours.  Invalid input(s): CK ------------------------------------------------------------------------------------------------------------------ Invalid  input(s): POCBNP  ---------------------------------------------------------------------------------------------------------------  Urinalysis No results found for: COLORURINE, APPEARANCEUR, LABSPEC, PHURINE, GLUCOSEU, HGBUR, BILIRUBINUR, KETONESUR, PROTEINUR, UROBILINOGEN, NITRITE, LEUKOCYTESUR   RADIOLOGY: Ct Knee Right Wo  Contrast  Result Date: 07/04/2018 CLINICAL DATA:  57 year old female with fall and fracture of the tibial plateau. EXAM: CT OF THE right KNEE WITHOUT CONTRAST TECHNIQUE: Multidetector CT imaging of the right knee was performed according to the standard protocol. Multiplanar CT image reconstructions were also generated. COMPARISON:  Radiograph of the right knee dated 07/03/2018 FINDINGS: Bones/Joint/Cartilage There is a comminuted, intra-articular fracture of the lateral tibial plateau. There is a depressed central fracture fragment and lateral displaced fragment with approximately 1 cm distraction gap. There is no dislocation. There is a small suprapatellar hemarthrosis. Ligaments Suboptimally assessed by CT. Muscles and Tendons There is diffuse stranding of the medial and lateral patellofemoral ligaments. Soft tissues There is stranding of the soft tissues of the posterior knee and popliteal fossa. No fluid collection or large hematoma. IMPRESSION: Comminuted intra-articular and displaced fracture of the lateral tibial plateau. Electronically Signed   By: Elgie Collard M.D.   On: 07/04/2018 00:20   Dg Knee Complete 4 Views Right  Result Date: 07/03/2018 CLINICAL DATA:  C/o pain after flipflop got caught on step and fell backwards today EXAM: RIGHT KNEE - COMPLETE 4+ VIEW COMPARISON:  None. FINDINGS: There is an acute comminuted fracture of the LATERAL tibial plateau, associated with approximately 1 centimeter of LATERAL displacement. The MEDIAL plateau is intact. IMPRESSION: Acute fracture of the LATERAL tibial plateau. Electronically Signed   By: Norva Pavlov M.D.   On: 07/03/2018 21:53    EKG: Orders placed or performed during the hospital encounter of 10/23/15  . EKG 12 lead  . EKG 12 lead    IMPRESSION AND PLAN:  1.  Acute right tibial plateau fracture.  We will place knee immobilizer and continue pain control.  Orthopedics is consulted for further evaluation and treatment. 2.  Diabetes  type 2, well controlled.  Will monitor blood sugars before meals and at bedtime and use insulin treatment during the hospital stay.  3.  CKD 3, stable, creatinine at baseline.  Continue to monitor kidney function closely and avoid nephrotoxic medications. 4.  Anxiety disorder, stable, continue home medications.  All the records are reviewed and case discussed with ED provider. Management plans discussed with the patient, who is in agreement.  CODE STATUS: Code Status History    Date Active Date Inactive Code Status Order ID Comments User Context   04/27/2017 1407 04/27/2017 1827 Full Code [id number]  Annice Needy, MD Inpatient       TOTAL TIME TAKING CARE OF THIS PATIENT: 45 minutes.    Cammy Copa M.D on 07/04/2018 at 1:47 AM  Between 7am to 6pm - Pager - [phone]  After 6pm go to [email or web address] - password [email or web address] [facility] [place] at [facility]  [phone]  CC: Primary care physician; Leotis Shames, MD

## 2018-07-05 ENCOUNTER — Encounter
Admission: EM | Disposition: A | Discharge status: Home Health | Payer: Self-pay | Source: Home / Self Care | Attending: Orthopedic Surgery

## 2018-07-05 ENCOUNTER — Inpatient Hospital Stay: Payer: 59 | Admitting: Anesthesiology

## 2018-07-05 ENCOUNTER — Inpatient Hospital Stay: Payer: 59

## 2018-07-05 DIAGNOSIS — S82101A Unspecified fracture of upper end of right tibia, initial encounter for closed fracture: Secondary | ICD-10-CM | POA: Diagnosis present

## 2018-07-05 HISTORY — PX: ORIF TIBIA PLATEAU: SHX2132

## 2018-07-05 LAB — GLUCOSE, CAPILLARY
GLUCOSE-CAPILLARY: 136 mg/dL — AB (ref 70–99)
GLUCOSE-CAPILLARY: 101 mg/dL — AB (ref 70–99)
Glucose-Capillary: 124 mg/dL — ABNORMAL HIGH (ref 70–99)
Glucose-Capillary: 176 mg/dL — ABNORMAL HIGH (ref 70–99)

## 2018-07-05 LAB — HIV ANTIBODY (ROUTINE TESTING W REFLEX): HIV Screen 4th Generation wRfx: NONREACTIVE

## 2018-07-05 LAB — SURGICAL PCR SCREEN
MRSA, PCR: NEGATIVE
Staphylococcus aureus: NEGATIVE

## 2018-07-05 SURGERY — OPEN REDUCTION INTERNAL FIXATION (ORIF) TIBIAL PLATEAU
Anesthesia: Spinal | Laterality: Right

## 2018-07-05 MED ORDER — PROPOFOL 500 MG/50ML IV EMUL
INTRAVENOUS | Status: DC | PRN
  Administered 2018-07-05: 100 ug/kg/min via INTRAVENOUS

## 2018-07-05 MED ORDER — PROPOFOL 500 MG/50ML IV EMUL
INTRAVENOUS | Status: AC
  Filled 2018-07-05: qty 50

## 2018-07-05 MED ORDER — PHENYLEPHRINE HCL 10 MG/ML IJ SOLN
INTRAMUSCULAR | Status: DC | PRN
  Administered 2018-07-05: 200 ug via INTRAVENOUS
  Administered 2018-07-05 (×5): 100 ug via INTRAVENOUS

## 2018-07-05 MED ORDER — GLIMEPIRIDE 4 MG PO TABS
4.0000 mg | ORAL_TABLET | Freq: Every day | ORAL | Status: DC
  Administered 2018-07-06 – 2018-07-08 (×3): 4 mg via ORAL
  Filled 2018-07-05 (×3): qty 1

## 2018-07-05 MED ORDER — DOCUSATE SODIUM 100 MG PO CAPS
100.0000 mg | ORAL_CAPSULE | Freq: Two times a day (BID) | ORAL | Status: DC
  Administered 2018-07-06 – 2018-07-08 (×5): 100 mg via ORAL
  Filled 2018-07-05 (×5): qty 1

## 2018-07-05 MED ORDER — FENTANYL CITRATE (PF) 100 MCG/2ML IJ SOLN
25.0000 ug | INTRAMUSCULAR | Status: DC | PRN
  Administered 2018-07-05 (×2): 25 ug via INTRAVENOUS

## 2018-07-05 MED ORDER — BACITRACIN 50000 UNITS IM SOLR
INTRAMUSCULAR | Status: AC
  Filled 2018-07-05: qty 2

## 2018-07-05 MED ORDER — ONDANSETRON HCL 4 MG/2ML IJ SOLN
INTRAMUSCULAR | Status: AC
  Filled 2018-07-05: qty 2

## 2018-07-05 MED ORDER — ASPIRIN 81 MG PO CHEW
81.0000 mg | CHEWABLE_TABLET | Freq: Two times a day (BID) | ORAL | Status: DC
  Administered 2018-07-05 – 2018-07-08 (×6): 81 mg via ORAL
  Filled 2018-07-05 (×6): qty 1

## 2018-07-05 MED ORDER — FENTANYL CITRATE (PF) 100 MCG/2ML IJ SOLN
INTRAMUSCULAR | Status: AC
  Filled 2018-07-05: qty 2

## 2018-07-05 MED ORDER — PHENOL 1.4 % MT LIQD
1.0000 | OROMUCOSAL | Status: DC | PRN
  Filled 2018-07-05: qty 177

## 2018-07-05 MED ORDER — KETOROLAC TROMETHAMINE 15 MG/ML IJ SOLN
15.0000 mg | Freq: Four times a day (QID) | INTRAMUSCULAR | Status: AC
  Administered 2018-07-05 – 2018-07-06 (×3): 15 mg via INTRAVENOUS
  Filled 2018-07-05 (×5): qty 1

## 2018-07-05 MED ORDER — BUPIVACAINE HCL (PF) 0.5 % IJ SOLN
INTRAMUSCULAR | Status: DC | PRN
  Administered 2018-07-05: 3 mL

## 2018-07-05 MED ORDER — CEFAZOLIN SODIUM-DEXTROSE 2-4 GM/100ML-% IV SOLN
2.0000 g | Freq: Four times a day (QID) | INTRAVENOUS | Status: AC
  Administered 2018-07-05 – 2018-07-06 (×2): 2 g via INTRAVENOUS
  Filled 2018-07-05 (×2): qty 100

## 2018-07-05 MED ORDER — MAGNESIUM CITRATE PO SOLN
1.0000 | Freq: Once | ORAL | Status: DC | PRN
  Filled 2018-07-05: qty 296

## 2018-07-05 MED ORDER — MORPHINE SULFATE (PF) 2 MG/ML IV SOLN: 0.5000 mg | INTRAVENOUS | Status: DC | PRN

## 2018-07-05 MED ORDER — METOCLOPRAMIDE HCL 5 MG PO TABS
5.0000 mg | ORAL_TABLET | Freq: Three times a day (TID) | ORAL | Status: DC | PRN
  Filled 2018-07-05: qty 2

## 2018-07-05 MED ORDER — MAGNESIUM HYDROXIDE 400 MG/5ML PO SUSP
30.0000 mL | Freq: Every day | ORAL | Status: DC | PRN
  Filled 2018-07-05: qty 30

## 2018-07-05 MED ORDER — PROPOFOL 10 MG/ML IV BOLUS
INTRAVENOUS | Status: AC
  Filled 2018-07-05: qty 40

## 2018-07-05 MED ORDER — ACETAMINOPHEN 500 MG PO TABS
500.0000 mg | ORAL_TABLET | Freq: Four times a day (QID) | ORAL | Status: AC
  Administered 2018-07-05: 500 mg via ORAL
  Filled 2018-07-05: qty 1

## 2018-07-05 MED ORDER — CEFAZOLIN SODIUM-DEXTROSE 2-4 GM/100ML-% IV SOLN
2.0000 g | INTRAVENOUS | Status: AC
  Administered 2018-07-05: 2 g via INTRAVENOUS
  Filled 2018-07-05: qty 100

## 2018-07-05 MED ORDER — BISACODYL 10 MG RE SUPP
10.0000 mg | Freq: Every day | RECTAL | Status: DC | PRN
  Filled 2018-07-05: qty 1

## 2018-07-05 MED ORDER — LIDOCAINE HCL URETHRAL/MUCOSAL 2 % EX GEL
CUTANEOUS | Status: AC
  Filled 2018-07-05: qty 5

## 2018-07-05 MED ORDER — ACETAMINOPHEN 325 MG PO TABS: 325.0000 mg | ORAL_TABLET | Freq: Four times a day (QID) | ORAL | Status: DC | PRN

## 2018-07-05 MED ORDER — MENTHOL 3 MG MT LOZG
1.0000 | LOZENGE | OROMUCOSAL | Status: DC | PRN
  Filled 2018-07-05: qty 9

## 2018-07-05 MED ORDER — SODIUM CHLORIDE 0.9 % IV SOLN
Freq: Once | INTRAVENOUS | Status: AC
  Administered 2018-07-05: 20:00:00 via INTRAVENOUS

## 2018-07-05 MED ORDER — LACTATED RINGERS IV SOLN
INTRAVENOUS | Status: DC
  Administered 2018-07-05: 21:00:00 via INTRAVENOUS

## 2018-07-05 MED ORDER — METOCLOPRAMIDE HCL 5 MG/ML IJ SOLN: 5.0000 mg | Freq: Three times a day (TID) | INTRAMUSCULAR | Status: DC | PRN

## 2018-07-05 MED ORDER — MIDAZOLAM HCL 5 MG/5ML IJ SOLN
INTRAMUSCULAR | Status: DC | PRN
  Administered 2018-07-05: 2 mg via INTRAVENOUS

## 2018-07-05 MED ORDER — PIOGLITAZONE HCL 30 MG PO TABS
30.0000 mg | ORAL_TABLET | Freq: Every day | ORAL | Status: DC
  Administered 2018-07-06 – 2018-07-08 (×3): 30 mg via ORAL
  Filled 2018-07-05 (×3): qty 1

## 2018-07-05 MED ORDER — HYDROCODONE-ACETAMINOPHEN 5-325 MG PO TABS
1.0000 | ORAL_TABLET | ORAL | Status: DC | PRN
  Administered 2018-07-06 – 2018-07-08 (×8): 2 via ORAL
  Filled 2018-07-05 (×9): qty 2

## 2018-07-05 MED ORDER — OXYMETAZOLINE HCL 0.05 % NA SOLN
NASAL | Status: AC
  Filled 2018-07-05: qty 15

## 2018-07-05 MED ORDER — TRAMADOL HCL 50 MG PO TABS
50.0000 mg | ORAL_TABLET | Freq: Four times a day (QID) | ORAL | Status: DC
  Administered 2018-07-05 – 2018-07-08 (×11): 50 mg via ORAL
  Filled 2018-07-05 (×11): qty 1

## 2018-07-05 MED ORDER — EPHEDRINE SULFATE 50 MG/ML IJ SOLN
5.0000 mg | Freq: Once | INTRAMUSCULAR | Status: AC
  Administered 2018-07-05: 5 mg via INTRAVENOUS

## 2018-07-05 MED ORDER — SODIUM CHLORIDE 0.9 % IV SOLN
INTRAVENOUS | Status: DC | PRN
  Administered 2018-07-05: 25 ug/min via INTRAVENOUS

## 2018-07-05 MED ORDER — PROPOFOL 10 MG/ML IV BOLUS
INTRAVENOUS | Status: AC
  Filled 2018-07-05: qty 20

## 2018-07-05 MED ORDER — ACETAMINOPHEN 10 MG/ML IV SOLN
INTRAVENOUS | Status: DC | PRN
  Administered 2018-07-05: 1000 mg via INTRAVENOUS

## 2018-07-05 MED ORDER — BUPIVACAINE HCL (PF) 0.5 % IJ SOLN
INTRAMUSCULAR | Status: AC
  Filled 2018-07-05: qty 10

## 2018-07-05 MED ORDER — OXYMETAZOLINE HCL 0.05 % NA SOLN
NASAL | Status: DC | PRN
  Administered 2018-07-05: 2 via NASAL

## 2018-07-05 MED ORDER — SODIUM CHLORIDE 0.9 % IJ SOLN
INTRAMUSCULAR | Status: AC
  Filled 2018-07-05: qty 10

## 2018-07-05 MED ORDER — MIDAZOLAM HCL 2 MG/2ML IJ SOLN
INTRAMUSCULAR | Status: AC
  Filled 2018-07-05: qty 2

## 2018-07-05 MED ORDER — ONDANSETRON HCL 4 MG/2ML IJ SOLN
4.0000 mg | Freq: Once | INTRAMUSCULAR | Status: AC | PRN
  Administered 2018-07-05: 4 mg via INTRAVENOUS

## 2018-07-05 MED ORDER — BUPIVACAINE-EPINEPHRINE (PF) 0.5% -1:200000 IJ SOLN
INTRAMUSCULAR | Status: AC
  Filled 2018-07-05: qty 30

## 2018-07-05 MED ORDER — PROPOFOL 10 MG/ML IV BOLUS
INTRAVENOUS | Status: DC | PRN
  Administered 2018-07-05: 50 mg via INTRAVENOUS

## 2018-07-05 MED ORDER — FENTANYL CITRATE (PF) 100 MCG/2ML IJ SOLN
INTRAMUSCULAR | Status: DC | PRN
  Administered 2018-07-05 (×2): 50 ug via INTRAVENOUS

## 2018-07-05 MED ORDER — FENTANYL CITRATE (PF) 100 MCG/2ML IJ SOLN
INTRAMUSCULAR | Status: AC
  Administered 2018-07-05: 25 ug via INTRAVENOUS
  Filled 2018-07-05: qty 2

## 2018-07-05 MED ORDER — EPHEDRINE SULFATE 50 MG/ML IJ SOLN
INTRAMUSCULAR | Status: AC
  Administered 2018-07-05: 5 mg via INTRAVENOUS
  Filled 2018-07-05: qty 1

## 2018-07-05 SURGICAL SUPPLY — 56 items
BIT DRILL CALIBR QC 2.5X250 (BIT) ×1 IMPLANT
BIT DRILL CALIBR QC 2.8X250 (BIT) ×1 IMPLANT
BIT DRILL QC 3.5X110 (BIT) ×1 IMPLANT
BNDG COHESIVE 4X5 TAN STRL (GAUZE/BANDAGES/DRESSINGS) ×2 IMPLANT
BNDG ESMARK 6X12 TAN STRL LF (GAUZE/BANDAGES/DRESSINGS) ×2 IMPLANT
BONE CANC CHIPS 20CC PCAN1/4 (Bone Implant) ×2 IMPLANT
BRUSH SCRUB EZ  4% CHG (MISCELLANEOUS) ×1
BRUSH SCRUB EZ 4% CHG (MISCELLANEOUS) ×1 IMPLANT
CANISTER SUCT 1200ML W/VALVE (MISCELLANEOUS) ×2 IMPLANT
CHIPS CANC BONE 20CC PCAN1/4 (Bone Implant) ×1 IMPLANT
CHLORAPREP W/TINT 26ML (MISCELLANEOUS) ×2 IMPLANT
COOLER POLAR GLACIER W/PUMP (MISCELLANEOUS) ×2 IMPLANT
CUFF TOURN 24 STER (MISCELLANEOUS) IMPLANT
CUFF TOURN 30 STER DUAL PORT (MISCELLANEOUS) IMPLANT
DRAPE C-ARM XRAY 36X54 (DRAPES) ×2 IMPLANT
DRAPE C-ARMOR (DRAPES) ×2 IMPLANT
DRAPE INCISE IOBAN 66X45 STRL (DRAPES) ×2 IMPLANT
DRAPE TABLE BACK 80X90 (DRAPES) ×2 IMPLANT
DRSG AQUACEL AG ADV 3.5X10 (GAUZE/BANDAGES/DRESSINGS) ×2 IMPLANT
ELECT REM PT RETURN 9FT ADLT (ELECTROSURGICAL) ×2
ELECTRODE REM PT RTRN 9FT ADLT (ELECTROSURGICAL) ×1 IMPLANT
GAUZE PETRO XEROFOAM 1X8 (MISCELLANEOUS) ×2 IMPLANT
GAUZE SPONGE 4X4 12PLY STRL (GAUZE/BANDAGES/DRESSINGS) ×2 IMPLANT
GLOVE INDICATOR 8.0 STRL GRN (GLOVE) ×2 IMPLANT
GLOVE SURG ORTHO 8.0 STRL STRW (GLOVE) ×2 IMPLANT
GOWN STRL REUS W/ TWL LRG LVL3 (GOWN DISPOSABLE) ×1 IMPLANT
GOWN STRL REUS W/ TWL XL LVL3 (GOWN DISPOSABLE) ×1 IMPLANT
GOWN STRL REUS W/TWL LRG LVL3 (GOWN DISPOSABLE) ×2
GOWN STRL REUS W/TWL XL LVL3 (GOWN DISPOSABLE) ×2
GRAFT BNE CANC CHIPS 1-8 20CC (Bone Implant) IMPLANT
K-WIRE 2.0X150M (WIRE) ×2
KIT TURNOVER KIT A (KITS) ×2 IMPLANT
KWIRE 2.0X150M (WIRE) IMPLANT
MAT ABSORB  FLUID 56X50 GRAY (MISCELLANEOUS) ×1
MAT ABSORB FLUID 56X50 GRAY (MISCELLANEOUS) ×1 IMPLANT
NS IRRIG 1000ML POUR BTL (IV SOLUTION) ×2 IMPLANT
PACK EXTREMITY ARMC (MISCELLANEOUS) ×2 IMPLANT
PAD CAST CTTN 4X4 STRL (SOFTGOODS) ×1 IMPLANT
PAD PREP 24X41 OB/GYN DISP (PERSONAL CARE ITEMS) ×2 IMPLANT
PAD WRAPON POLAR KNEE (MISCELLANEOUS) ×1 IMPLANT
PADDING CAST COTTON 4X4 STRL (SOFTGOODS) ×2
PLATE TIBIA VA-LCP 6H RT (Plate) ×1 IMPLANT
SCREW CORTEX 3.5 26MM (Screw) ×1 IMPLANT
SCREW CORTEX 3.5 38MM (Screw) ×2 IMPLANT
SCREW CORTEX 3.5X75MM (Screw) ×1 IMPLANT
SCREW LOCK CORT ST 3.5X26 (Screw) IMPLANT
SCREW LOCK CORT ST 3.5X38 (Screw) IMPLANT
SCREW LOCKING 3.5X70MM VA (Screw) ×3 IMPLANT
SPONGE LAP 18X18 RF (DISPOSABLE) ×2 IMPLANT
STAPLER SKIN PROX 35W (STAPLE) ×2 IMPLANT
STOCKINETTE BIAS CUT 6 980064 (GAUZE/BANDAGES/DRESSINGS) ×2 IMPLANT
STOCKINETTE IMPERVIOUS 9X36 MD (GAUZE/BANDAGES/DRESSINGS) ×2 IMPLANT
SUT VIC AB 2-0 CT1 27 (SUTURE) ×4
SUT VIC AB 2-0 CT1 TAPERPNT 27 (SUTURE) ×1 IMPLANT
SUT VICRYL+ 3-0 36IN CT-1 (SUTURE) ×2 IMPLANT
WRAPON POLAR PAD KNEE (MISCELLANEOUS) ×2

## 2018-07-05 NOTE — Progress Notes (Signed)
[facility] [place] at [facility]   PATIENT NAME: Alyssa Maynard    MR#:  [id number]  DATE OF BIRTH:  10/01/1961  SUBJECTIVE:  CHIEF COMPLAINT:   Chief Complaint  Patient presents with  . Fall   - complains of significant right knee pain- going to OR today  REVIEW OF SYSTEMS:  Review of Systems  Constitutional: Negative for chills and fever.  HENT: Negative for congestion, ear discharge, hearing loss and nosebleeds.   Eyes: Negative for blurred vision and double vision.  Respiratory: Negative for cough, shortness of breath and wheezing.   Cardiovascular: Negative for chest pain and palpitations.  Gastrointestinal: Negative for abdominal pain, constipation, diarrhea, nausea and vomiting.  Genitourinary: Negative for dysuria.  Musculoskeletal: Positive for joint pain and myalgias.  Skin: Negative for rash.  Neurological: Negative for dizziness, focal weakness, seizures, weakness and headaches.  Psychiatric/Behavioral: Negative for depression.    DRUG ALLERGIES:   Allergies  Allergen Reactions  . Latex Rash and Other (See Comments)    Burns skin     VITALS:  Blood pressure (!) 104/57, pulse 73, temperature 98.6 F (37 C), temperature source Oral, resp. rate 18, height 5\' 5"  (1.651 m), weight 121 kg, last menstrual period 08/27/2015, SpO2 91 %.  PHYSICAL EXAMINATION:  Physical Exam  GENERAL:  57 y.o.-year-old obese patient lying in the bed with no acute distress.  EYES: Pupils equal, round, reactive to light and accommodation. No scleral icterus. Extraocular muscles intact.  HEENT: Head atraumatic, normocephalic. Oropharynx and nasopharynx clear.  NECK:  Supple, no jugular venous distention. No thyroid enlargement, no tenderness.  LUNGS: Normal breath sounds bilaterally, no wheezing, rales,rhonchi or crepitation. No use of accessory muscles of respiration.  Decreased bibasilar breath sounds CARDIOVASCULAR: S1, S2 normal. No murmurs, rubs, or gallops.    ABDOMEN: Soft, nontender, nondistended. Bowel sounds present. No organomegaly or mass.  EXTREMITIES: Right leg is immobilized.  No pedal edema, cyanosis, or clubbing.  NEUROLOGIC: Cranial nerves II through XII are intact. Muscle strength 5/5 in all extremities. Sensation intact. Gait not checked.  PSYCHIATRIC: The patient is alert and oriented x 3.  SKIN: No obvious rash, lesion, or ulcer.    LABORATORY PANEL:   CBC Recent Labs  Lab 07/04/18 0608  WBC 8.1  HGB 12.4  HCT 35.8  PLT 295   ------------------------------------------------------------------------------------------------------------------  Chemistries  Recent Labs  Lab 07/04/18 0608  NA 137  K 4.0  CL 101  CO2 27  GLUCOSE 108*  BUN 23*  CREATININE 1.32*  CALCIUM 9.0   ------------------------------------------------------------------------------------------------------------------  Cardiac Enzymes No results for input(s): TROPONINI in the last 168 hours. ------------------------------------------------------------------------------------------------------------------  RADIOLOGY:  Ct Knee Right Wo Contrast  Result Date: 07/04/2018 CLINICAL DATA:  57 year old female with fall and fracture of the tibial plateau. EXAM: CT OF THE right KNEE WITHOUT CONTRAST TECHNIQUE: Multidetector CT imaging of the right knee was performed according to the standard protocol. Multiplanar CT image reconstructions were also generated. COMPARISON:  Radiograph of the right knee dated 07/03/2018 FINDINGS: Bones/Joint/Cartilage There is a comminuted, intra-articular fracture of the lateral tibial plateau. There is a depressed central fracture fragment and lateral displaced fragment with approximately 1 cm distraction gap. There is no dislocation. There is a small suprapatellar hemarthrosis. Ligaments Suboptimally assessed by CT. Muscles and Tendons There is diffuse stranding of the medial and lateral patellofemoral ligaments. Soft tissues  There is stranding of the soft tissues of the posterior knee and popliteal fossa. No fluid collection or large hematoma. IMPRESSION:  Comminuted intra-articular and displaced fracture of the lateral tibial plateau. Electronically Signed   By: Elgie Collard M.D.   On: 07/04/2018 00:20   Dg Knee Complete 4 Views Right  Result Date: 07/03/2018 CLINICAL DATA:  C/o pain after flipflop got caught on step and fell backwards today EXAM: RIGHT KNEE - COMPLETE 4+ VIEW COMPARISON:  None. FINDINGS: There is an acute comminuted fracture of the LATERAL tibial plateau, associated with approximately 1 centimeter of LATERAL displacement. The MEDIAL plateau is intact. IMPRESSION: Acute fracture of the LATERAL tibial plateau. Electronically Signed   By: Norva Pavlov M.D.   On: 07/03/2018 21:53    EKG:   Orders placed or performed during the hospital encounter of 10/23/15  . EKG 12 lead  . EKG 12 lead    ASSESSMENT AND PLAN:   57 year old female with past medical history significant for diabetes, CKD stage III, hypertension sleep apnea presents to hospital secondary to mechanical fall and right knee pain  1.  Right tibial plateau fracture-CT showing comminuted intra-articular and displaced fracture of right tibial plateau. - Pain control - appreciate ortho consult, going to OR today - will need PT tomorrow, likely non weight bearing on right leg for 6-8 weeks -might need rehab vs home health at discharge  2.  Diabetes mellitus-patient on Januvia, Amaryl and Actos at home Will restart her amaryl and actos  tomorrow based on her sugars here. NPO today - Currently on linagliptide and sliding scale insulin  3.  Hypertension-on lisinopril and hydrochlorothiazide   4.  Depression and anxiety-continue Celexa  5.  DVT prophylaxis-subcu heparin  6.  CKD stage III-stable.  Monitor  Case discussed with Dr. Odis Luster from ortho    All the records are reviewed and case discussed with Care Management/Social  Workerr. Management plans discussed with the patient, family and they are in agreement.  CODE STATUS: Full code  TOTAL TIME TAKING CARE OF THIS PATIENT: 36 minutes.   POSSIBLE D/C IN 1 to 2 DAYS, DEPENDING ON CLINICAL CONDITION.   Enid Baas M.D on 07/05/2018 at 12:07 PM  Between 7am to 6pm - Pager - [phone]  After 6pm go to [email or web address] - Social research officer, government  [facility] [place] [facility]  Office  [phone]  CC: Primary care physician; Leotis Shames, MD

## 2018-07-05 NOTE — Anesthesia Procedure Notes (Signed)
Spinal  Patient location during procedure: OR Start time: 07/05/2018 4:10 PM End time: 07/05/2018 4:17 PM Staffing Performed: anesthesiologist  Preanesthetic Checklist Completed: patient identified, site marked, surgical consent, pre-op evaluation, timeout performed, IV checked, risks and benefits discussed and monitors and equipment checked Spinal Block Patient position: sitting Prep: Betadine Patient monitoring: heart rate, continuous pulse ox, blood pressure and cardiac monitor Approach: midline Location: L4-5 Injection technique: single-shot Needle Needle type: Whitacre and Introducer  Needle gauge: 24 G Needle length: 9 cm Needle insertion depth: 8 cm Additional Notes Negative paresthesia. Negative blood return. Positive free-flowing CSF. Expiration date of kit checked and confirmed. Patient tolerated procedure well, without complications.

## 2018-07-05 NOTE — Anesthesia Post-op Follow-up Note (Signed)
Anesthesia QCDR form completed.        

## 2018-07-05 NOTE — Progress Notes (Signed)
Patient got transferred from 2A last night. Case of Right knee fracture. Scheduled for Knee ORIF 07/05/18. Surgical PCR done, negative for MRSA,MSSA. Consent for surgery prepared, still need to be obtained. CHG bath rendered this morning. R knee immobilizer in place. Good right pedal pulse noted. Medicated with PRN dilaudid 2mg  at 2112, 0118 and 0523, verbalized alleviation of pain post pain med administration, with acceptable pain level of 4. Will continue to monitor.

## 2018-07-05 NOTE — Op Note (Signed)
07/05/2018  6:49 PM  PATIENT:  Alyssa Maynard    PRE-OPERATIVE DIAGNOSIS:  right tibial plaeau fracture  POST-OPERATIVE DIAGNOSIS:  Same  PROCEDURE:  OPEN REDUCTION INTERNAL FIXATION (ORIF) TIBIAL PLATEAU, RIGHT   SURGEON:  Lyndle Herrlich, MD  ASSIST: None   ANESTHESIA: Spinal  PREOPERATIVE INDICATIONS:  Alyssa Maynard is a  57 y.o. female with a diagnosis of right tibial plaeau fracture who elected for surgical management after extensive discussion of  the risks benefits and alternatives  with the patient preoperatively including but not limited to the risks of infection, bleeding, nerve injury, cardiopulmonary complications, the need for revision surgery, among others, and the patient was willing to proceed.  EBL: 50 cc  OPERATIVE IMPLANTS: Synthes variable angle lateral locking plate, cancellous bone chips  OPERATIVE FINDINGS: Comminuted split, lateral plateau fracture with large articular fragment displaced central/posterior  OPERATIVE PROCEDURE: The patient was brought to the operating room and underwent satisfactory anesthesia and was placed in the supine position on the fracture table.  The operative leg was prepped and draped in sterile fashion.  A longitudinal lazy S type lateral incision was made centered over Gerdy's tubercle with dissection carried out sharply to fascia.  The vastus lateralis was split and carried distally in line with the tibialis anterior.  The lateral tibia was exposed.  Irrigation was used. The fracture fragments were manipulated into the best possible position due to the comminution. The central fragment was elevated using bone tamps. The bone void was filled with cancellous chips. A Synthes variable angle lateral tibial plate was aligned along the tibia.  It was temporarily fixated with K wires distally and proximally.  The plate was compressed distally with large bone holding clamps. Next a compression screw was placed proximally with excellent restoration of  the lateral joint line.  Cortical screws were then placed through the distal portion of the plate.  The remaining proximal holes were filled with locking screws.  Fluoroscopy showed overall good alignment on AP and lateral views. Wound was again irrigated and closed with #1 Vicryl, #2 Quill on fascia, 2-0 Vicryl on subcutaneous tissue and staples on all skin areas and staples for the medial stab wounds.  A sterile dressing was applied.  Sponge and needle count was correct.  Soft long-leg dressing, Cryocuff and knee immobilizer were applied.  The patient was awakened and taken to recovery in good condition.   Cassell Smiles, MD

## 2018-07-05 NOTE — Transfer of Care (Signed)
Immediate Anesthesia Transfer of Care Note  Patient: Alyssa Maynard  Procedure(s) Performed: OPEN REDUCTION INTERNAL FIXATION (ORIF) TIBIAL PLATEAU (Right )  Patient Location: PACU  Anesthesia Type:Spinal  Level of Consciousness: awake, alert  and oriented  Airway & Oxygen Therapy: Patient Spontanous Breathing and Patient connected to face mask oxygen  Post-op Assessment: Report given to RN and Post -op Vital signs reviewed and stable  Post vital signs: Reviewed and stable  Last Vitals:  Vitals Value Taken Time  BP 101/63 07/05/2018  6:42 PM  Temp    Pulse 71 07/05/2018  6:44 PM  Resp 21 07/05/2018  6:44 PM  SpO2 93 % 07/05/2018  6:44 PM  Vitals shown include unvalidated device data.  Last Pain:  Vitals:   07/05/18 1450  TempSrc: Oral  PainSc:       Patients Stated Pain Goal: 1 (07/05/18 1241)  Complications: No apparent anesthesia complications

## 2018-07-05 NOTE — Anesthesia Preprocedure Evaluation (Signed)
Anesthesia Evaluation  Patient identified by MRN, date of birth, ID band Patient awake    Reviewed: Allergy & Precautions, NPO status , Patient's Chart, lab work & pertinent test results  History of Anesthesia Complications (+) history of anesthetic complications (breathing difficulties post-op)  Airway Mallampati: III       Dental   Pulmonary sleep apnea and Continuous Positive Airway Pressure Ventilation , neg COPD, Current Smoker,           Cardiovascular hypertension, Pt. on medications (-) Past MI and (-) CHF (-) dysrhythmias (-) Valvular Problems/Murmurs     Neuro/Psych neg Seizures Anxiety Depression    GI/Hepatic Neg liver ROS, GERD  Medicated and Controlled,  Endo/Other  diabetes, Type 2, Oral Hypoglycemic Agents  Renal/GU Renal InsufficiencyRenal disease     Musculoskeletal   Abdominal   Peds  Hematology   Anesthesia Other Findings   Reproductive/Obstetrics                             Anesthesia Physical Anesthesia Plan  ASA: III and emergent  Anesthesia Plan: Spinal   Post-op Pain Management:    Induction:   PONV Risk Score and Plan:   Airway Management Planned:   Additional Equipment:   Intra-op Plan:   Post-operative Plan:   Informed Consent: I have reviewed the patients History and Physical, chart, labs and discussed the procedure including the risks, benefits and alternatives for the proposed anesthesia with the patient or authorized representative who has indicated his/her understanding and acceptance.     Plan Discussed with:   Anesthesia Plan Comments:         Anesthesia Quick Evaluation

## 2018-07-05 NOTE — Progress Notes (Signed)
Pt alert and oriented resting in room. Family at bedside. Pt complains of pain in right knee. PRN pain medication given. Pt awaiting surgery. Pt NPO. IV infusing NS. Pt on room air. No other complaints at this time.

## 2018-07-05 NOTE — Progress Notes (Signed)
Pt off floor to OR

## 2018-07-06 ENCOUNTER — Encounter: Payer: Self-pay | Admitting: Orthopedic Surgery

## 2018-07-06 LAB — CBC
HEMATOCRIT: 33.9 % — AB (ref 35.0–47.0)
HEMOGLOBIN: 11.3 g/dL — AB (ref 12.0–16.0)
MCH: 30.7 pg (ref 26.0–34.0)
MCHC: 33.3 g/dL (ref 32.0–36.0)
MCV: 92.3 fL (ref 80.0–100.0)
Platelets: 276 10*3/uL (ref 150–440)
RBC: 3.67 MIL/uL — AB (ref 3.80–5.20)
RDW: 15.3 % — ABNORMAL HIGH (ref 11.5–14.5)
WBC: 7.7 10*3/uL (ref 3.6–11.0)

## 2018-07-06 LAB — BASIC METABOLIC PANEL
ANION GAP: 10 (ref 5–15)
BUN: 14 mg/dL (ref 6–20)
CO2: 27 mmol/L (ref 22–32)
Calcium: 8.8 mg/dL — ABNORMAL LOW (ref 8.9–10.3)
Chloride: 103 mmol/L (ref 98–111)
Creatinine, Ser: 1.1 mg/dL — ABNORMAL HIGH (ref 0.44–1.00)
GFR calc non Af Amer: 54 mL/min — ABNORMAL LOW (ref >60)
Glucose, Bld: 117 mg/dL — ABNORMAL HIGH (ref 70–99)
POTASSIUM: 3.8 mmol/L (ref 3.5–5.1)
SODIUM: 140 mmol/L (ref 135–145)

## 2018-07-06 LAB — GLUCOSE, CAPILLARY
GLUCOSE-CAPILLARY: 188 mg/dL — AB (ref 70–99)
Glucose-Capillary: 99 mg/dL (ref 70–99)
Glucose-Capillary: 120 mg/dL — ABNORMAL HIGH (ref 70–99)
Glucose-Capillary: 104 mg/dL — ABNORMAL HIGH (ref 70–99)
Glucose-Capillary: 126 mg/dL — ABNORMAL HIGH (ref 70–99)

## 2018-07-06 NOTE — Anesthesia Postprocedure Evaluation (Signed)
Anesthesia Post Note  Patient: Alyssa Maynard  Procedure(s) Performed: OPEN REDUCTION INTERNAL FIXATION (ORIF) TIBIAL PLATEAU (Right )  Patient location during evaluation: Nursing Unit Anesthesia Type: Spinal Level of consciousness: awake, awake and alert, oriented and patient cooperative Pain management: pain level controlled Vital Signs Assessment: post-procedure vital signs reviewed and stable Respiratory status: spontaneous breathing, nonlabored ventilation and respiratory function stable Cardiovascular status: stable Postop Assessment: no headache, no backache, patient able to bend at knees, no apparent nausea or vomiting and adequate PO intake Anesthetic complications: no     Last Vitals:  Vitals:   07/06/18 0345 07/06/18 0735  BP: 122/66 (!) 125/103  Pulse: 71 72  Resp: 19 18  Temp: 36.7 C 37 C  SpO2: 92% 96%    Last Pain:  Vitals:   07/06/18 1247  TempSrc:   PainSc: 7                  Baileigh Modisette,  Pearlie Nies R

## 2018-07-06 NOTE — Care Management Note (Signed)
Case Management Note  Patient Details  Name: Alyssa Maynard MRN: [id number] Date of Birth: 1960-12-25  Subjective/Objective: S/P tibial plateau fracture, closed fracture of right promimal tibia with ORIF. Met with patient at bedside. She lives at home with her spouse. She will need a wheelchair and bariatric bedside commode. Ordered from [place] with [facility]. Case discussed with Dr. Harlow Mares, patient will not need home health PT. She is to be non weight bearing. It is anticipated patient will discharge home tomorrow.                     Action/Plan: DME with [facility].   Expected Discharge Date:                  Expected Discharge Plan:  Home/Self Care  In-House Referral:     Discharge planning Services  CM Consult  Post Acute Care Choice:  Durable Medical Equipment Choice offered to:  Patient  DME Arranged: Wheelchair, Bedside Commode (bariatric)  DME Agency:  [place]:    [place] Agency:     Status of Service:  In process, will continue to follow  If discussed at Long Length of Stay Meetings, dates discussed:    Additional Comments:  Jolly Mango, RN 07/06/2018, 2:36 PM

## 2018-07-06 NOTE — Progress Notes (Signed)
Subjective:  Patient reports pain as moderate.    Objective:   VITALS:   Vitals:   07/06/18 0122 07/06/18 0345 07/06/18 0500 07/06/18 0735  BP: 127/83 122/66  (!) 125/103  Pulse: 75 71  72  Resp: 20 19  18   Temp: 99.2 F (37.3 C) 98 F (36.7 C)  98.6 F (37 C)  TempSrc: Oral Oral  Oral  SpO2: 97% 92%  96%  Weight:   122.5 kg   Height:        PHYSICAL EXAM:  Neurovascular intact Sensation intact distally Dorsiflexion/Plantar flexion intact Incision: dressing C/D/I Compartment soft  LABS  Results for orders placed or performed during the hospital encounter of 07/03/18 (from the past 24 hour(s))  Glucose, capillary     Status: Abnormal   Collection Time: 07/05/18 11:52 AM  Result Value Ref Range   Glucose-Capillary 101 (H) 70 - 99 mg/dL  Glucose, capillary     Status: Abnormal   Collection Time: 07/05/18  6:55 PM  Result Value Ref Range   Glucose-Capillary 124 (H) 70 - 99 mg/dL  Glucose, capillary     Status: Abnormal   Collection Time: 07/05/18  9:10 PM  Result Value Ref Range   Glucose-Capillary 176 (H) 70 - 99 mg/dL  Basic metabolic panel     Status: Abnormal   Collection Time: 07/06/18  3:37 AM  Result Value Ref Range   Sodium 140 135 - 145 mmol/L   Potassium 3.8 3.5 - 5.1 mmol/L   Chloride 103 98 - 111 mmol/L   CO2 27 22 - 32 mmol/L   Glucose, Bld 117 (H) 70 - 99 mg/dL   BUN 14 6 - 20 mg/dL   Creatinine, Ser 1.61 (H) 0.44 - 1.00 mg/dL   Calcium 8.8 (L) 8.9 - 10.3 mg/dL   GFR calc non Af Amer 54 (L) >60 mL/min   GFR calc Af Amer >60 >60 mL/min   Anion gap 10 5 - 15  CBC     Status: Abnormal   Collection Time: 07/06/18  3:37 AM  Result Value Ref Range   WBC 7.7 3.6 - 11.0 K/uL   RBC 3.67 (L) 3.80 - 5.20 MIL/uL   Hemoglobin 11.3 (L) 12.0 - 16.0 g/dL   HCT 09.6 (L) 04.5 - 40.9 %   MCV 92.3 80.0 - 100.0 fL   MCH 30.7 26.0 - 34.0 pg   MCHC 33.3 32.0 - 36.0 g/dL   RDW 81.1 (H) 91.4 - 78.2 %   Platelets 276 150 - 440 K/uL  Glucose, capillary      Status: Abnormal   Collection Time: 07/06/18  7:37 AM  Result Value Ref Range   Glucose-Capillary 120 (H) 70 - 99 mg/dL    Dg Knee 1-2 Views Right  Result Date: 07/05/2018 CLINICAL DATA:  Right knee fracture EXAM: RIGHT KNEE - 1-2 VIEW; DG C-ARM 61-120 MIN COMPARISON:  07/03/2018 FINDINGS: Five low resolution intraoperative spot views of the right knee. Total fluoroscopy time was 30 seconds. Surgical plate and screw fixation of the proximal tibial fracture with decreased displacement of tibial fracture fragments. IMPRESSION: Intraoperative fluoroscopic assistance provided during surgical fixation of tibial plateau fracture. Electronically Signed   By: Jasmine Pang M.D.   On: 07/05/2018 18:17   Dg C-arm 1-60 Min  Result Date: 07/05/2018 CLINICAL DATA:  Right knee fracture EXAM: RIGHT KNEE - 1-2 VIEW; DG C-ARM 61-120 MIN COMPARISON:  07/03/2018 FINDINGS: Five low resolution intraoperative spot views of the right knee. Total fluoroscopy time  was 30 seconds. Surgical plate and screw fixation of the proximal tibial fracture with decreased displacement of tibial fracture fragments. IMPRESSION: Intraoperative fluoroscopic assistance provided during surgical fixation of tibial plateau fracture. Electronically Signed   By: Jasmine Pang M.D.   On: 07/05/2018 18:17   Dg C-arm 1-60 Min-no Report  Result Date: 07/05/2018 Fluoroscopy was utilized by the requesting physician.  No radiographic interpretation.    Assessment/Plan: 1 Day Post-Op   Active Problems:   Tibial plateau fracture   Closed fracture of right proximal tibia   Up with therapy D/C IV fluids Plan for discharge tomorrow, based on progress with PT   Alyssa Maynard , MD 07/06/2018, 8:57 AM

## 2018-07-06 NOTE — Progress Notes (Signed)
[facility] - [place] [facility]   PATIENT NAME: Alyssa Maynard    MR#:  [id number]  DATE OF BIRTH:  1961-05-13  SUBJECTIVE:  CHIEF COMPLAINT:   Chief Complaint  Patient presents with  . Fall   - complains of some right knee pain- s/p surgery and immobilizer.  REVIEW OF SYSTEMS:  Review of Systems  Constitutional: Negative for chills and fever.  HENT: Negative for congestion, ear discharge, hearing loss and nosebleeds.   Eyes: Negative for blurred vision and double vision.  Respiratory: Negative for cough, shortness of breath and wheezing.   Cardiovascular: Negative for chest pain and palpitations.  Gastrointestinal: Negative for abdominal pain, constipation, diarrhea, nausea and vomiting.  Genitourinary: Negative for dysuria.  Musculoskeletal: Positive for joint pain and myalgias.  Skin: Negative for rash.  Neurological: Negative for dizziness, focal weakness, seizures, weakness and headaches.  Psychiatric/Behavioral: Negative for depression.    DRUG ALLERGIES:   Allergies  Allergen Reactions  . Latex Rash and Other (See Comments)    Burns skin     VITALS:  Blood pressure (!) 125/103, pulse 72, temperature 98.6 F (37 C), temperature source Oral, resp. rate 18, height 5\' 5"  (1.651 m), weight 122.5 kg, last menstrual period 08/27/2015, SpO2 96 %.  PHYSICAL EXAMINATION:  Physical Exam  GENERAL:  57 y.o.-year-old obese patient lying in the bed with no acute distress.  EYES: Pupils equal, round, reactive to light and accommodation. No scleral icterus. Extraocular muscles intact.  HEENT: Head atraumatic, normocephalic. Oropharynx and nasopharynx clear.  NECK:  Supple, no jugular venous distention. No thyroid enlargement, no tenderness.  LUNGS: Normal breath sounds bilaterally, no wheezing, rales,rhonchi or crepitation. No use of accessory muscles of respiration.  Decreased bibasilar breath sounds CARDIOVASCULAR: S1, S2 normal. No murmurs, rubs, or  gallops.  ABDOMEN: Soft, nontender, nondistended. Bowel sounds present. No organomegaly or mass.  EXTREMITIES: Right leg is immobilized.  No pedal edema, cyanosis, or clubbing.  NEUROLOGIC: Cranial nerves II through XII are intact. Muscle strength 5/5 in all extremities. Sensation intact. Gait not checked.  PSYCHIATRIC: The patient is alert and oriented x 3.  SKIN: No obvious rash, lesion, or ulcer.    LABORATORY PANEL:   CBC Recent Labs  Lab 07/06/18 0337  WBC 7.7  HGB 11.3*  HCT 33.9*  PLT 276   ------------------------------------------------------------------------------------------------------------------  Chemistries  Recent Labs  Lab 07/06/18 0337  NA 140  K 3.8  CL 103  CO2 27  GLUCOSE 117*  BUN 14  CREATININE 1.10*  CALCIUM 8.8*   ------------------------------------------------------------------------------------------------------------------  Cardiac Enzymes No results for input(s): TROPONINI in the last 168 hours. ------------------------------------------------------------------------------------------------------------------  RADIOLOGY:  Dg Knee 1-2 Views Right  Result Date: 07/05/2018 CLINICAL DATA:  Right knee fracture EXAM: RIGHT KNEE - 1-2 VIEW; DG C-ARM 61-120 MIN COMPARISON:  07/03/2018 FINDINGS: Five low resolution intraoperative spot views of the right knee. Total fluoroscopy time was 30 seconds. Surgical plate and screw fixation of the proximal tibial fracture with decreased displacement of tibial fracture fragments. IMPRESSION: Intraoperative fluoroscopic assistance provided during surgical fixation of tibial plateau fracture. Electronically Signed   By: Jasmine Pang M.D.   On: 07/05/2018 18:17   Dg C-arm 1-60 Min  Result Date: 07/05/2018 CLINICAL DATA:  Right knee fracture EXAM: RIGHT KNEE - 1-2 VIEW; DG C-ARM 61-120 MIN COMPARISON:  07/03/2018 FINDINGS: Five low resolution intraoperative spot views of the right knee. Total fluoroscopy time was  30 seconds. Surgical plate and screw fixation of the proximal tibial fracture  with decreased displacement of tibial fracture fragments. IMPRESSION: Intraoperative fluoroscopic assistance provided during surgical fixation of tibial plateau fracture. Electronically Signed   By: Jasmine Pang M.D.   On: 07/05/2018 18:17   Dg C-arm 1-60 Min-no Report  Result Date: 07/05/2018 Fluoroscopy was utilized by the requesting physician.  No radiographic interpretation.    EKG:   Orders placed or performed during the hospital encounter of 10/23/15  . EKG 12 lead  . EKG 12 lead    ASSESSMENT AND PLAN:   57 year old female with past medical history significant for diabetes, CKD stage III, hypertension sleep apnea presents to hospital secondary to mechanical fall and right knee pain  1.  Right tibial plateau fracture-CT showing comminuted intra-articular and displaced fracture of right tibial plateau. - Pain control - appreciate ortho consult, s/p surgery 07/05/18 - non weight bearing on right leg for 6-8 weeks - need home health at discharge, lives with husband and PT to advise transfer from bed to wheel chair.  2.  Diabetes mellitus-patient on Januvia, Amaryl and Actos at home -sliding scale insulin  3.  Hypertension-on lisinopril and hydrochlorothiazide   4.  Depression and anxiety-continue Celexa  5.  DVT prophylaxis-subcu heparin  6.  CKD stage III-stable.  Monitor   All the records are reviewed and case discussed with Care Management/Social Workerr. Management plans discussed with the patient, family and they are in agreement.  CODE STATUS: Full code  TOTAL TIME TAKING CARE OF THIS PATIENT: 36 minutes.   POSSIBLE D/C IN 1 to 2 DAYS, DEPENDING ON CLINICAL CONDITION.   Altamese Dilling M.D on 07/06/2018 at 3:13 PM  Between 7am to 6pm - Pager - [phone]  After 6pm go to [email or web address] - Social research officer, government  [facility]  Office  [phone]  CC: Primary  care physician; Leotis Shames, MD

## 2018-07-06 NOTE — Progress Notes (Signed)
Physical Therapy Treatment Patient Details Name: Alyssa Maynard MRN: [id number] DOB: April 06, 1961 Today's Date: 07/06/2018    History of Present Illness Pt admitted for tibial plateau fx from going up steps, caught sandal leading to hyperextension of knee and then resulting in fall. Pt is now s/p ORIF and POD 1. History includes anxiety, CKD, HTN, GERD, depression, and DM. Pt very eager to participate in PT.    PT Comments    Pt agreeable to PT; reports 7/10 pain in RLE. Pt has questions regarding therapy post acute care stay. Answered pt's questions to her satisfaction. Pt participated in supine and seated LE exercises; given written HEP with thorough explanation and instruction for progression while at home and NWB on R. Encouraged strengthening through LLE, core, UEs and as able R hip. Pt requires Min A to Min guard for limited mobility at this time, but demonstrates good ability to tolerate transfers and limited ambulation. Continue PT to progress strength, endurance to improve all functional mobility.    Follow Up Recommendations  Home health PT;Supervision for mobility/OOB     Equipment Recommendations  Wheelchair (measurements PT);3in1 (PT)    Recommendations for Other Services       Precautions / Restrictions Precautions Precautions: Fall Restrictions Weight Bearing Restrictions: Yes RLE Weight Bearing: Non weight bearing    Mobility  Bed Mobility Overal bed mobility: Needs Assistance Bed Mobility: Supine to Sit     Supine to sit: Min assist     General bed mobility comments: For RLE  Transfers Overall transfer level: Needs assistance Equipment used: Rolling walker (2 wheeled) Transfers: Sit to/from Stand Sit to Stand: Min guard         General transfer comment: Good timing, hand placement and strength to stand with LLE. Min guard for safety  Ambulation/Gait Ambulation/Gait assistance: Min guard Gait Distance (Feet): 5 Feet Assistive device: Rolling walker (2  wheeled) Gait Pattern/deviations: Step-to pattern     General Gait Details: Pivots LLE bed to chair   Stairs             Wheelchair Mobility    Modified Rankin (Stroke Patients Only)       Balance Overall balance assessment: Needs assistance;History of Falls Sitting-balance support: Feet supported Sitting balance-Leahy Scale: Good     Standing balance support: Bilateral upper extremity supported Standing balance-Leahy Scale: Fair                              Cognition Arousal/Alertness: Awake/alert Behavior During Therapy: WFL for tasks assessed/performed Overall Cognitive Status: Within Functional Limits for tasks assessed                                        Exercises General Exercises - Lower Extremity Ankle Circles/Pumps: AROM;Both;20 reps;Supine Quad Sets: Strengthening;Both;10 reps;Supine Gluteal Sets: Strengthening;Both;10 reps;Supine Short Arc Quad: AROM;Left;10 reps;Supine Long Arc Quad: AROM;Left;10 reps;Seated Heel Slides: AROM;Strengthening;Left;10 reps;Supine(resisted ext) Hip ABduction/ADduction: AROM;Right;10 reps;Standing Straight Leg Raises: Other (comment)(educated on HEP) Other Exercises Other Exercises: educated pt in heelslide with abdominal brace and resisted ext using a T band Other Exercises: Pt given written HEP  Other Exercises: Chair push ups   General Comments        Pertinent Vitals/Pain Pain Assessment: 0-10 Pain Score: 7  Pain Location: R LE Pain Descriptors / Indicators: Constant;Aching Pain Intervention(s): Monitored during session;Premedicated before session;Repositioned;Ice  applied    Home Living                      Prior Function            PT Goals (current goals can now be found in the care plan section) Progress towards PT goals: Progressing toward goals    Frequency    BID      PT Plan Current plan remains appropriate    Co-evaluation               AM-PAC PT "6 Clicks" Daily Activity  Outcome Measure  Difficulty turning over in bed (including adjusting bedclothes, sheets and blankets)?: Unable Difficulty moving from lying on back to sitting on the side of the bed? : Unable Difficulty sitting down on and standing up from a chair with arms (e.g., wheelchair, bedside commode, etc,.)?: Unable Help needed moving to and from a bed to chair (including a wheelchair)?: A Little Help needed walking in hospital room?: A Little Help needed climbing 3-5 steps with a railing? : A Lot 6 Click Score: 11    End of Session Equipment Utilized During Treatment: Gait belt;Right knee immobilizer Activity Tolerance: Patient tolerated treatment well Patient left: in chair;with call bell/phone within reach;Other (comment)(polar care in place; declined alarm, but will call for nurse)   PT Visit Diagnosis: Muscle weakness (generalized) (M62.81);Difficulty in walking, not elsewhere classified (R26.2);Pain Pain - Right/Left: Right Pain - part of body: Leg     Time: 2774-1287 PT Time Calculation (min) (ACUTE ONLY): 39 min  Charges:  $Gait Training: 8-22 mins $Therapeutic Exercise: 8-22 mins $Therapeutic Activity: 8-22 mins                     Scot Dock, PTA 07/06/2018, 5:00 PM

## 2018-07-06 NOTE — Evaluation (Signed)
Physical Therapy Evaluation Patient Details Name: Alyssa Maynard MRN: [id number] DOB: 1961-09-23 Today's Date: 07/06/2018   History of Present Illness  Pt admitted for tibial plateau fx from going up steps, caught sandal leading to hyperextension of knee and then resulting in fall. Pt is now s/p ORIF and POD 1. History includes anxiety, CKD, HTN, GERD, depression, and DM. Pt very eager to participate in PT.  Clinical Impression  Pt is a pleasant 57 year old female who was admitted for tibial plateau fx s/p ORIF. Pt performs bed mobility with cga, transfers with min assist, and ambulation with cga and RW. Pt demonstrates deficits with strength/mobility/endurance. Able to maintain correct WBing status safely. Will need WC for long distance as she fatigues with short distances. Would benefit from skilled PT to address above deficits and promote optimal return to PLOF. Recommend transition to HHPT upon discharge from acute hospitalization.       Follow Up Recommendations Home health PT;Supervision for mobility/OOB    Equipment Recommendations  Wheelchair (measurements PT);3in1 (PT)(reports they should have access to RW-will follow up)    Recommendations for Other Services       Precautions / Restrictions Precautions Precautions: Fall Restrictions Weight Bearing Restrictions: Yes RLE Weight Bearing: Non weight bearing      Mobility  Bed Mobility Overal bed mobility: Needs Assistance Bed Mobility: Supine to Sit     Supine to sit: Min guard     General bed mobility comments: safe technique with ability to follow commands and scoot towards EOB. Once seated, keeps R LE elevated off floor. UPright posture noted  Transfers Overall transfer level: Needs assistance Equipment used: Rolling walker (2 wheeled) Transfers: Sit to/from Stand Sit to Stand: Min assist         General transfer comment: upright posture noted with cues for correct hand placement. ONce standing, upright posture  noted and pt able to maintain correct WBing status.  Ambulation/Gait Ambulation/Gait assistance: Min guard Gait Distance (Feet): 5 Feet Assistive device: Rolling walker (2 wheeled) Gait Pattern/deviations: Step-to pattern     General Gait Details: Able to pivot on L foot to [facility] and then hop to recliner. Safe technique performed.  Stairs            Wheelchair Mobility    Modified Rankin (Stroke Patients Only)       Balance Overall balance assessment: Needs assistance;History of Falls Sitting-balance support: Feet supported Sitting balance-Leahy Scale: Good     Standing balance support: Bilateral upper extremity supported Standing balance-Leahy Scale: Fair                               Pertinent Vitals/Pain Pain Assessment: Faces Faces Pain Scale: Hurts a little bit Pain Location: R LE Pain Descriptors / Indicators: Operative site guarding Pain Intervention(s): Limited activity within patient's tolerance;Premedicated before session;Repositioned;Ice applied    Home Living Family/patient expects to be discharged to:: Private residence Living Arrangements: Spouse/significant other Available Help at Discharge: Family;Available PRN/intermittently Type of Home: House Home Access: Stairs to enter Entrance Stairs-Rails: (is getting ramp) Entrance Stairs-Number of Steps: 2 Home Layout: One level Home Equipment: None      Prior Function Level of Independence: Independent         Comments: working and fully independent     Hand Dominance        Extremity/Trunk Assessment   Upper Extremity Assessment Upper Extremity Assessment: Overall WFL for tasks assessed  Lower Extremity Assessment Lower Extremity Assessment: Generalized weakness(R LE grossly 3/5; L LE grossly 5/5)       Communication   Communication: No difficulties  Cognition Arousal/Alertness: Awake/alert Behavior During Therapy: WFL for tasks assessed/performed Overall  Cognitive Status: Within Functional Limits for tasks assessed                                        General Comments      Exercises Other Exercises Other Exercises: able to pivot to [facility] to void with cga. Able to perform self hygiene with supervision. Needs occasional cues for safety. Then min assist for transfers and ambualtion.   Assessment/Plan    PT Assessment Patient needs continued PT services  PT Problem List Decreased strength;Decreased balance;Decreased mobility;Decreased knowledge of use of DME;Pain;Obesity       PT Treatment Interventions Gait training;DME instruction;Therapeutic exercise;Balance training    PT Goals (Current goals can be found in the Care Plan section)  Acute Rehab PT Goals Patient Stated Goal: to go home PT Goal Formulation: With patient Time For Goal Achievement: 07/20/18 Potential to Achieve Goals: Good    Frequency BID   Barriers to discharge        Co-evaluation               AM-PAC PT "6 Clicks" Daily Activity  Outcome Measure Difficulty turning over in bed (including adjusting bedclothes, sheets and blankets)?: Unable Difficulty moving from lying on back to sitting on the side of the bed? : Unable Difficulty sitting down on and standing up from a chair with arms (e.g., wheelchair, bedside commode, etc,.)?: Unable Help needed moving to and from a bed to chair (including a wheelchair)?: A Little Help needed walking in hospital room?: A Little Help needed climbing 3-5 steps with a railing? : A Lot 6 Click Score: 11    End of Session Equipment Utilized During Treatment: Gait belt;Right knee immobilizer Activity Tolerance: Patient tolerated treatment well Patient left: in chair;with chair alarm set;with SCD's reapplied Nurse Communication: Mobility status PT Visit Diagnosis: Muscle weakness (generalized) (M62.81);Difficulty in walking, not elsewhere classified (R26.2);Pain Pain - Right/Left: Right Pain - part of  body: Leg    Time: 1914-7829 PT Time Calculation (min) (ACUTE ONLY): 24 min   Charges:   PT Evaluation $PT Eval Low Complexity: 1 Low PT Treatments $Therapeutic Activity: 8-22 mins        Elizabeth Palau, PT, DPT [phone]   Graison Leinberger 07/06/2018, 10:27 AM

## 2018-07-06 NOTE — Progress Notes (Signed)
Clinical Social Worker (CSW) received SNF consult. PT is recommending home health. RN case manager aware of above. Please reconsult if future social work needs arise. CSW signing off.   Maevyn Riordan, LCSW [phone] 

## 2018-07-07 LAB — CBC
HEMATOCRIT: 34.8 % — AB (ref 35.0–47.0)
Hemoglobin: 11.8 g/dL — ABNORMAL LOW (ref 12.0–16.0)
MCH: 31.1 pg (ref 26.0–34.0)
MCHC: 33.8 g/dL (ref 32.0–36.0)
MCV: 91.9 fL (ref 80.0–100.0)
Platelets: 284 10*3/uL (ref 150–440)
RBC: 3.79 MIL/uL — ABNORMAL LOW (ref 3.80–5.20)
RDW: 15.5 % — ABNORMAL HIGH (ref 11.5–14.5)
WBC: 8.4 10*3/uL (ref 3.6–11.0)

## 2018-07-07 LAB — GLUCOSE, CAPILLARY
GLUCOSE-CAPILLARY: 119 mg/dL — AB (ref 70–99)
Glucose-Capillary: 152 mg/dL — ABNORMAL HIGH (ref 70–99)
Glucose-Capillary: 127 mg/dL — ABNORMAL HIGH (ref 70–99)
Glucose-Capillary: 164 mg/dL — ABNORMAL HIGH (ref 70–99)

## 2018-07-07 MED ORDER — OXYCODONE HCL ER 10 MG PO T12A
10.0000 mg | EXTENDED_RELEASE_TABLET | Freq: Two times a day (BID) | ORAL | Status: DC
  Administered 2018-07-07 – 2018-07-08 (×3): 10 mg via ORAL
  Filled 2018-07-07 (×3): qty 1

## 2018-07-07 NOTE — Progress Notes (Signed)
PT Cancellation Note  Patient Details Name: Alyssa Maynard MRN: [id number] DOB: [birth year]   Cancelled Treatment:    Reason Eval/Treat Not Completed: Patient declined, no reason specified. Attempted to see patient for second treatment session. Pt reports that she is discharging this afternoon. She just returned to bed from recliner. She does not have any questions/concerns for therapy. Educated pt on assistive device use as well as using sheet/strap to assist moving RLE. Pt reports feeling confident with her HEP. Pt thanked therapist for coming to answer questions. Time unbilled.  Sharalyn Ink Huprich PT, DPT, GCS  Huprich,Jason 07/07/2018, 4:09 PM

## 2018-07-07 NOTE — Care Management (Signed)
Case discussed with Alyssa Spry, PA. Patient having pain issues. Dilaudid IV ordered then to be switched to po Hydrocodone later today. Spouse in the room with concerns about patient going home.  RNCM and CSW met with patient and spouse at bedside. Spouse voice concerns that patient was going to be alone during the day and had no assistance. RNCM provided them with a list of private sitters. He also was concerned about not having what she needed ready at home. I told them they could [facility] at [facility]. Ultimatley the spouse wanted patient to go to [facility] for 2 weeks. CSW spoke with patient and spouse regarding insurance requirements for SNF placement and why patient did not qualify for SNF placement. Patient is non weight bearing and PT is recommending PT. Dr. Harlow Mares also did not approve home health for patient. This was also discussed in detail with patient and spouse and the rational as to why PT was not ordered.  RNCM told them that patient was being discharged today if pain control was managed. Spouse stated this was not what the PA told them. He wanted to speak with Dr. Harlow Mares PA again to discuss discharge because he said the PA told them they could stay until tomorrow. RNCM called Alyssa Spry, PA and let him speak with the spouse and he told him that patient would be discharge today if patient pain was well controlled.  RNCM will follow up later today regarding pain control. Primary nurse updated.

## 2018-07-07 NOTE — Progress Notes (Signed)
[facility]   PATIENT NAME: Alyssa Maynard    MR#:  [id number]  DATE OF BIRTH:  1961-09-29  SUBJECTIVE:  CHIEF COMPLAINT:   Chief Complaint  Patient presents with  . Fall   - complains of some right knee pain- s/p surgery and immobilizer.  c/o pain in knee, but overall better.  REVIEW OF SYSTEMS:  Review of Systems  Constitutional: Negative for chills and fever.  HENT: Negative for congestion, ear discharge, hearing loss and nosebleeds.   Eyes: Negative for blurred vision and double vision.  Respiratory: Negative for cough, shortness of breath and wheezing.   Cardiovascular: Negative for chest pain and palpitations.  Gastrointestinal: Negative for abdominal pain, constipation, diarrhea, nausea and vomiting.  Genitourinary: Negative for dysuria.  Musculoskeletal: Positive for joint pain and myalgias.  Skin: Negative for rash.  Neurological: Negative for dizziness, focal weakness, seizures, weakness and headaches.  Psychiatric/Behavioral: Negative for depression.    DRUG ALLERGIES:   Allergies  Allergen Reactions  . Latex Rash and Other (See Comments)    Burns skin     VITALS:  Blood pressure (!) 146/74, pulse 80, temperature 97.9 F (36.6 C), temperature source Oral, resp. rate 19, height 5\' 5"  (1.651 m), weight 122.5 kg, last menstrual period 08/27/2015, SpO2 97 %.  PHYSICAL EXAMINATION:  Physical Exam  GENERAL:  57 y.o.-year-old obese patient lying in the bed with no acute distress.  EYES: Pupils equal, round, reactive to light and accommodation. No scleral icterus. Extraocular muscles intact.  HEENT: Head atraumatic, normocephalic. Oropharynx and nasopharynx clear.  NECK:  Supple, no jugular venous distention. No thyroid enlargement, no tenderness.  LUNGS: Normal breath sounds bilaterally, no wheezing, rales,rhonchi or crepitation. No use of accessory muscles of respiration.  Decreased bibasilar breath  sounds CARDIOVASCULAR: S1, S2 normal. No murmurs, rubs, or gallops.  ABDOMEN: Soft, nontender, nondistended. Bowel sounds present. No organomegaly or mass.  EXTREMITIES: Right leg is immobilized.  No pedal edema, cyanosis, or clubbing.  NEUROLOGIC: Cranial nerves II through XII are intact. Muscle strength 5/5 in all extremities. Sensation intact. Gait not checked.  PSYCHIATRIC: The patient is alert and oriented x 3.  SKIN: No obvious rash, lesion, or ulcer.    LABORATORY PANEL:   CBC Recent Labs  Lab 07/07/18 0351  WBC 8.4  HGB 11.8*  HCT 34.8*  PLT 284   ------------------------------------------------------------------------------------------------------------------  Chemistries  Recent Labs  Lab 07/06/18 0337  NA 140  K 3.8  CL 103  CO2 27  GLUCOSE 117*  BUN 14  CREATININE 1.10*  CALCIUM 8.8*   ------------------------------------------------------------------------------------------------------------------  Cardiac Enzymes No results for input(s): TROPONINI in the last 168 hours. ------------------------------------------------------------------------------------------------------------------  RADIOLOGY:  Dg Knee 1-2 Views Right  Result Date: 07/05/2018 CLINICAL DATA:  Right knee fracture EXAM: RIGHT KNEE - 1-2 VIEW; DG C-ARM 61-120 MIN COMPARISON:  07/03/2018 FINDINGS: Five low resolution intraoperative spot views of the right knee. Total fluoroscopy time was 30 seconds. Surgical plate and screw fixation of the proximal tibial fracture with decreased displacement of tibial fracture fragments. IMPRESSION: Intraoperative fluoroscopic assistance provided during surgical fixation of tibial plateau fracture. Electronically Signed   By: Jasmine Pang M.D.   On: 07/05/2018 18:17   Dg C-arm 1-60 Min  Result Date: 07/05/2018 CLINICAL DATA:  Right knee fracture EXAM: RIGHT KNEE - 1-2 VIEW; DG C-ARM 61-120 MIN COMPARISON:  07/03/2018 FINDINGS: Five low resolution  intraoperative spot views of the right knee. Total fluoroscopy time was 30 seconds. Surgical plate  and screw fixation of the proximal tibial fracture with decreased displacement of tibial fracture fragments. IMPRESSION: Intraoperative fluoroscopic assistance provided during surgical fixation of tibial plateau fracture. Electronically Signed   By: Jasmine Pang M.D.   On: 07/05/2018 18:17   Dg C-arm 1-60 Min-no Report  Result Date: 07/05/2018 Fluoroscopy was utilized by the requesting physician.  No radiographic interpretation.    EKG:   Orders placed or performed during the hospital encounter of 10/23/15  . EKG 12 lead  . EKG 12 lead    ASSESSMENT AND PLAN:   57 year old female with past medical history significant for diabetes, CKD stage III, hypertension sleep apnea presents to hospital secondary to mechanical fall and right knee pain  1.  Right tibial plateau fracture-CT showing comminuted intra-articular and displaced fracture of right tibial plateau. - Pain control - appreciate ortho consult, s/p surgery 07/05/18 - non weight bearing on right leg for 6-8 weeks - need home health at discharge, lives with husband and PT to advise transfer from bed to wheel chair. - As her pain was still not well controlled- advise to add oxycontin BID - She can be discharged with current oral pain meds.  2.  Diabetes mellitus-patient on Januvia, Amaryl and Actos at home -sliding scale insulin  3.  Hypertension-on lisinopril and hydrochlorothiazide   4.  Depression and anxiety-continue Celexa  5.  DVT prophylaxis-subcu heparin  6.  CKD stage III-stable.  Monitor  Pt is medically stable, plan for discharge from ortho- Will sign off now. Please call- if any further help needed  All the records are reviewed and case discussed with Care Management/Social Workerr. Management plans discussed with the patient, family and they are in agreement.  CODE STATUS: Full code  TOTAL TIME TAKING CARE OF  THIS PATIENT: 36 minutes.   POSSIBLE D/C IN 1 to 2 DAYS, DEPENDING ON CLINICAL CONDITION.   Altamese Dilling M.D on 07/07/2018 at 3:06 PM  Between 7am to 6pm - Pager - [phone]  After 6pm go to [email or web address] - Social research officer, government  [facility]  Office  [phone]  CC: Primary care physician; Leotis Shames, MD

## 2018-07-07 NOTE — Care Management (Signed)
Discharge cancelled by Dr. Odis Luster due to pain level and IV Dilaudid. Will Dc 9/5

## 2018-07-07 NOTE — Progress Notes (Signed)
Physical Therapy Treatment Patient Details Name: Alyssa Maynard MRN: [id number] DOB: 28-Jun-1961 Today's Date: 07/07/2018    History of Present Illness Pt admitted for tibial plateau fx from going up steps, caught sandal leading to hyperextension of knee and then resulting in fall. Pt is now s/p ORIF and POD 1. History includes anxiety, CKD, HTN, GERD, depression, and DM. Pt very eager to participate in PT.    PT Comments    Pt agreeable to PT; reports 5/10 pain currently. Notes speaking with M.D regarding pain management this morning after a terrible night. Pt states new regimen has started with improvement. Pt reports she will be going home today (no current discharge order) with EMS to get into the house. Pt states she has intermittent assist from family and friends available. Performed/reviewed all exercises from written HEP and assisted up in bed and to chair with Min A/Min guard. Pt will maintain R NWB for 2 months; currently, pt to go home at wheelchair level. Continue HHPT recommendation, for several visits, to ensure safe functional mobility in the home.    Follow Up Recommendations  Home health PT;Supervision for mobility/OOB     Equipment Recommendations  Wheelchair (measurements PT);3in1 (PT)    Recommendations for Other Services       Precautions / Restrictions Precautions Precautions: Fall Restrictions Weight Bearing Restrictions: Yes    Mobility  Bed Mobility Overal bed mobility: Needs Assistance Bed Mobility: Supine to Sit     Supine to sit: Min assist     General bed mobility comments: For RLE; instruction on ways for self assist  Transfers Overall transfer level: Needs assistance Equipment used: Rolling walker (2 wheeled) Transfers: Sit to/from Stand Sit to Stand: Min guard         General transfer comment: Unorthodox method with hand in middle of rw and standing up sideways, but able to maintain NWB on R and steady  Ambulation/Gait Ambulation/Gait  assistance: Min guard Gait Distance (Feet): 5 Feet Assistive device: Rolling walker (2 wheeled)       General Gait Details: Pivots on LLE   Stairs             Wheelchair Mobility    Modified Rankin (Stroke Patients Only)       Balance Overall balance assessment: Needs assistance;History of Falls Sitting-balance support: Feet supported Sitting balance-Leahy Scale: Good     Standing balance support: Bilateral upper extremity supported Standing balance-Leahy Scale: Fair                              Cognition Arousal/Alertness: Awake/alert Behavior During Therapy: WFL for tasks assessed/performed Overall Cognitive Status: Within Functional Limits for tasks assessed                                        Exercises Other Exercises Other Exercises: Performed and reviewed all exercises given in written HEP Other Exercises: Education on swelling managment through use of ice, elevation and ankle pumps    General Comments        Pertinent Vitals/Pain Pain Assessment: 0-10 Pain Score: 5  Pain Location: R LE Pain Descriptors / Indicators: Constant;Aching Pain Intervention(s): Premedicated before session;Other (comment)(Pt spoke with MD in a.m. for better mgmt; improving)    Home Living  Prior Function            PT Goals (current goals can now be found in the care plan section) Progress towards PT goals: Progressing toward goals    Frequency    BID      PT Plan Current plan remains appropriate    Co-evaluation              AM-PAC PT "6 Clicks" Daily Activity  Outcome Measure  Difficulty turning over in bed (including adjusting bedclothes, sheets and blankets)?: Unable Difficulty moving from lying on back to sitting on the side of the bed? : Unable Difficulty sitting down on and standing up from a chair with arms (e.g., wheelchair, bedside commode, etc,.)?: Unable Help needed moving to  and from a bed to chair (including a wheelchair)?: A Little Help needed walking in hospital room?: A Little Help needed climbing 3-5 steps with a railing? : Total 6 Click Score: 10    End of Session Equipment Utilized During Treatment: Gait belt;Right knee immobilizer Activity Tolerance: Patient tolerated treatment well Patient left: in chair;with call bell/phone within reach;Other (comment)   PT Visit Diagnosis: Muscle weakness (generalized) (M62.81);Difficulty in walking, not elsewhere classified (R26.2);Pain Pain - Right/Left: Right Pain - part of body: Leg     Time: 3300-7622 PT Time Calculation (min) (ACUTE ONLY): 30 min  Charges:  $Therapeutic Exercise: 8-22 mins $Therapeutic Activity: 8-22 mins                      Scot Dock, PTA 07/07/2018, 12:42 PM

## 2018-07-07 NOTE — Clinical Social Work Note (Signed)
Clinical Social Work Assessment  Patient Details  Name: Alyssa Maynard MRN: [id number] Date of Birth: 06/16/1961  Date of referral:  07/07/18               Reason for consult:  Facility Placement                Permission sought to share information with:    Permission granted to share information::     Name::        Agency::     Relationship::     Contact Information:     Housing/Transportation Living arrangements for the past 2 months:  Single Family Home Source of Information:  Patient, Spouse Patient Interpreter Needed:  None Criminal Activity/Legal Involvement Pertinent to Current Situation/Hospitalization:  No - Comment as needed Significant Relationships:  Spouse Lives with:  Spouse Do you feel safe going back to the place where you live?  Yes Need for family participation in patient care:  Yes (Comment)  Care giving concerns:  Patient lives in [place] with [name].    Social Worker assessment / plan:  Holiday representative (CSW) received verbal consult from Ortho PA that patient's husband wants SNF placement. CSW made Ortho PA that insurance will not pay for SNF. CSW and RN case manager met with patient and [name] was at bedside. Patient was alert and oriented X4 and was sitting up in the bed. Patient's husband stated that he wants patient to stay in the hospital for 4 or 5 days and go to [facility]. CSW explained that patient's insurance [employer] will not pay for SNF because she is non-weight bearing and can't participate in a lot of PT. CSW also explained that once patient is medically stable for discharge that [employer] will no longer pay for her to be in the hospital. Patient and husband verbalized their understanding. RN case manager explained home options and private duty caregivers. Plan is for patient to D/C home. Please reconsult if future social work needs arise. CSW signing off.   Employment status:  Disabled (Comment on whether or not currently receiving  Disability) Insurance information:  Managed Care PT Recommendations:  Home with [place] / Referral to community resources:  Other (Comment Required)(Patient will D/C home with home health. )  Patient/Family's Response to care:  Patient and her husband understand that insurance will not pay for SNF.   Patient/Family's Understanding of and Emotional Response to Diagnosis, Current Treatment, and Prognosis:  Patient and husband thanked CSW for visit.   Emotional Assessment Appearance:  Appears stated age Attitude/Demeanor/Rapport:    Affect (typically observed):  Accepting, Pleasant Orientation:  Oriented to Self, Oriented to Place, Oriented to  Time, Oriented to Situation Alcohol / Substance use:  Not Applicable Psych involvement (Current and /or in the community):  No (Comment)  Discharge Needs  Concerns to be addressed:  Discharge Planning Concerns Readmission within the last 30 days:  No Current discharge risk:  Dependent with Mobility Barriers to Discharge:  Continued Medical Work up   [employer], Veronia Beets, LCSW 07/07/2018, 9:46 AM

## 2018-07-08 LAB — CBC
HCT: 32.4 % — ABNORMAL LOW (ref 35.0–47.0)
Hemoglobin: 11.2 g/dL — ABNORMAL LOW (ref 12.0–16.0)
MCH: 31.8 pg (ref 26.0–34.0)
MCHC: 34.6 g/dL (ref 32.0–36.0)
MCV: 92.1 fL (ref 80.0–100.0)
PLATELETS: 305 10*3/uL (ref 150–440)
RBC: 3.51 MIL/uL — ABNORMAL LOW (ref 3.80–5.20)
RDW: 15.5 % — ABNORMAL HIGH (ref 11.5–14.5)
WBC: 8.4 10*3/uL (ref 3.6–11.0)

## 2018-07-08 LAB — GLUCOSE, CAPILLARY
Glucose-Capillary: 114 mg/dL — ABNORMAL HIGH (ref 70–99)
Glucose-Capillary: 175 mg/dL — ABNORMAL HIGH (ref 70–99)

## 2018-07-08 MED ORDER — ASPIRIN 81 MG PO CHEW: 81.0000 mg | CHEWABLE_TABLET | Freq: Two times a day (BID) | ORAL | 0 refills | Status: DC

## 2018-07-08 MED ORDER — HYDROCODONE-ACETAMINOPHEN 5-325 MG PO TABS: 1.0000 | ORAL_TABLET | ORAL | 0 refills | Status: DC | PRN

## 2018-07-08 MED ORDER — OXYCODONE HCL ER 10 MG PO T12A: 10.0000 mg | EXTENDED_RELEASE_TABLET | Freq: Two times a day (BID) | ORAL | 0 refills | Status: AC

## 2018-07-08 MED ORDER — DOCUSATE SODIUM 100 MG PO CAPS: 100.0000 mg | ORAL_CAPSULE | Freq: Two times a day (BID) | ORAL | 0 refills | Status: DC

## 2018-07-08 NOTE — Care Management (Addendum)
Wheelchair and bedside commode order requested from MD. DME already delivered by [facility]. EMS packet created/completed. RN updated.

## 2018-07-08 NOTE — Discharge Summary (Addendum)
Physician Discharge Summary  Patient ID: Alyssa Maynard MRN: [id number] DOB/AGE: 59/09/1961 59 y.o.  Admit date: 07/03/2018 Discharge date: 07/08/2018  Admission Diagnoses:  right tibial plaeau fracture Tibial plateau fracture  Discharge Diagnoses:  right tibial plaeau fracture Principal Problem:   Tibial plateau fracture Active Problems:   Closed fracture of right proximal tibia   Past Medical History:  Diagnosis Date  . Anxiety   . Chronic kidney disease 04/2017   stage 3  . Complication of anesthesia    pt experiences difficulty breathing after anesthesia due to sleep apnea and smoking  . Depression   . Diabetes mellitus without complication (HCC)   . Dyspnea   . GERD (gastroesophageal reflux disease)   . Hypertension   . Sleep apnea     Surgeries: Procedure(s): OPEN REDUCTION INTERNAL FIXATION (ORIF) TIBIAL PLATEAU on 07/05/2018   Consultants (if any): Treatment Team:  Lyndle Herrlich, MD  Discharged Condition: Improved  Hospital Course: Alyssa Maynard is an 57 y.o. female who was admitted 07/03/2018 with a diagnosis of  right tibial plaeau fracture Tibial plateau fracture and went to the operating room on 07/05/2018 and underwent the above named procedures.  Her pain was under control by POD #3 and deemed stable for discharge.   She was given perioperative antibiotics:  Anti-infectives (From admission, onward)   Start     Dose/Rate Route Frequency Ordered Stop   07/05/18 2230  ceFAZolin (ANCEF) IVPB 2g/100 mL premix     2 g 200 mL/hr over 30 Minutes Intravenous Every 6 hours 07/05/18 2041 07/06/18 0551   07/05/18 1359  ceFAZolin (ANCEF) IVPB 2g/100 mL premix     2 g 200 mL/hr over 30 Minutes Intravenous 30 min pre-op 07/05/18 1359 07/05/18 1706    .  She was given sequential compression devices, early ambulation, and aspirin for DVT prophylaxis.  She benefited maximally from the hospital stay and there were no complications.    Recent vital signs:  Vitals:    07/07/18 2259 07/08/18 0829  BP: 121/64 (!) 142/64  Pulse: 75 70  Resp: 16   Temp: 97.6 F (36.4 C) 98.3 F (36.8 C)  SpO2: 94% 94%    Recent laboratory studies:  Lab Results  Component Value Date   HGB 11.2 (L) 07/08/2018   HGB 11.8 (L) 07/07/2018   HGB 11.3 (L) 07/06/2018   Lab Results  Component Value Date   WBC 8.4 07/08/2018   PLT 305 07/08/2018   No results found for: INR Lab Results  Component Value Date   NA 140 07/06/2018   K 3.8 07/06/2018   CL 103 07/06/2018   CO2 27 07/06/2018   BUN 14 07/06/2018   CREATININE 1.10 (H) 07/06/2018   GLUCOSE 117 (H) 07/06/2018    Discharge Medications:   Allergies as of 07/08/2018      Reactions   Latex Rash, Other (See Comments)   Burns skin      Medication List    STOP taking these medications   acetaminophen-codeine 300-30 MG tablet Commonly known as:  TYLENOL #3     TAKE these medications   aspirin 81 MG chewable tablet Chew 1 tablet (81 mg total) by mouth 2 (two) times daily.   cholecalciferol 1000 units tablet Commonly known as:  VITAMIN D Take 2,000 Units by mouth daily.   docusate sodium 100 MG capsule Commonly known as:  COLACE Take 1 capsule (100 mg total) by mouth 2 (two) times daily.   escitalopram 20 MG  tablet Commonly known as:  LEXAPRO Take 20 mg by mouth daily.   esomeprazole 40 MG capsule Commonly known as:  NEXIUM Take 40 mg by mouth daily.   glimepiride 4 MG tablet Commonly known as:  AMARYL Take 4 mg by mouth daily.   HYDROcodone-acetaminophen 5-325 MG tablet Commonly known as:  NORCO/VICODIN Take 1-2 tablets by mouth every 4 (four) hours as needed for moderate pain (pain score 4-6).   HYDROcodone-acetaminophen 5-325 MG tablet Commonly known as:  NORCO/VICODIN Take 1 tablet by mouth every 4 (four) hours as needed for moderate pain.   lisinopril-hydrochlorothiazide 20-12.5 MG tablet Commonly known as:  PRINZIDE,ZESTORETIC Take 1 tablet by mouth daily.   ONE TOUCH ULTRA TEST  test strip Generic drug:  glucose blood TEST once daily   oxyCODONE 10 mg 12 hr tablet Commonly known as:  OXYCONTIN Take 1 tablet (10 mg total) by mouth every 12 (twelve) hours.   pioglitazone 30 MG tablet Commonly known as:  ACTOS Take 1 tablet by mouth daily.   sitaGLIPtin 50 MG tablet Commonly known as:  JANUVIA Take 50 mg by mouth daily.   tiZANidine 2 MG tablet Commonly known as:  ZANAFLEX 1-2 tablets at bedtime as needed for muscle spasm   traMADol 50 MG tablet Commonly known as:  ULTRAM Take 50 mg by mouth.            Durable Medical Equipment  (From admission, onward)         Start     Ordered   07/08/18 0904  For home use only DME standard manual wheelchair with seat cushion  Once    Comments:  Patient suffers from OPEN REDUCTION INTERNAL FIXATION (ORIF) TIBIAL PLATEAU (right) on 07/05/2018  which impairs their ability to perform daily activities like toileting and bathing in the home.  A walker will not resolve  issue with performing activities of daily living. A wheelchair will allow patient to safely perform daily activities. Patient can safely propel the wheelchair in the home or has a caregiver who can provide assistance.  Accessories: elevating leg rests (ELRs), wheel locks, extensions and anti-tippers. Patient will need bariatric wheelchair.   07/08/18 0903   07/08/18 0900  For home use only DME Bedside commode  Once    Question:  Patient needs a bedside commode to treat with the following condition  Answer:  Difficulty walking   07/08/18 0900          Diagnostic Studies: Dg Knee 1-2 Views Right  Result Date: 07/05/2018 CLINICAL DATA:  Right knee fracture EXAM: RIGHT KNEE - 1-2 VIEW; DG C-ARM 61-120 MIN COMPARISON:  07/03/2018 FINDINGS: Five low resolution intraoperative spot views of the right knee. Total fluoroscopy time was 30 seconds. Surgical plate and screw fixation of the proximal tibial fracture with decreased displacement of tibial fracture  fragments. IMPRESSION: Intraoperative fluoroscopic assistance provided during surgical fixation of tibial plateau fracture. Electronically Signed   By: Jasmine Pang M.D.   On: 07/05/2018 18:17   Ct Knee Right Wo Contrast  Result Date: 07/04/2018 CLINICAL DATA:  57 year old female with fall and fracture of the tibial plateau. EXAM: CT OF THE right KNEE WITHOUT CONTRAST TECHNIQUE: Multidetector CT imaging of the right knee was performed according to the standard protocol. Multiplanar CT image reconstructions were also generated. COMPARISON:  Radiograph of the right knee dated 07/03/2018 FINDINGS: Bones/Joint/Cartilage There is a comminuted, intra-articular fracture of the lateral tibial plateau. There is a depressed central fracture fragment and lateral displaced fragment with  approximately 1 cm distraction gap. There is no dislocation. There is a small suprapatellar hemarthrosis. Ligaments Suboptimally assessed by CT. Muscles and Tendons There is diffuse stranding of the medial and lateral patellofemoral ligaments. Soft tissues There is stranding of the soft tissues of the posterior knee and popliteal fossa. No fluid collection or large hematoma. IMPRESSION: Comminuted intra-articular and displaced fracture of the lateral tibial plateau. Electronically Signed   By: Elgie Collard M.D.   On: 07/04/2018 00:20   Dg Knee Complete 4 Views Right  Result Date: 07/03/2018 CLINICAL DATA:  C/o pain after flipflop got caught on step and fell backwards today EXAM: RIGHT KNEE - COMPLETE 4+ VIEW COMPARISON:  None. FINDINGS: There is an acute comminuted fracture of the LATERAL tibial plateau, associated with approximately 1 centimeter of LATERAL displacement. The MEDIAL plateau is intact. IMPRESSION: Acute fracture of the LATERAL tibial plateau. Electronically Signed   By: Norva Pavlov M.D.   On: 07/03/2018 21:53   Dg C-arm 1-60 Min  Result Date: 07/05/2018 CLINICAL DATA:  Right knee fracture EXAM: RIGHT KNEE - 1-2  VIEW; DG C-ARM 61-120 MIN COMPARISON:  07/03/2018 FINDINGS: Five low resolution intraoperative spot views of the right knee. Total fluoroscopy time was 30 seconds. Surgical plate and screw fixation of the proximal tibial fracture with decreased displacement of tibial fracture fragments. IMPRESSION: Intraoperative fluoroscopic assistance provided during surgical fixation of tibial plateau fracture. Electronically Signed   By: Jasmine Pang M.D.   On: 07/05/2018 18:17   Dg C-arm 1-60 Min-no Report  Result Date: 07/05/2018 Fluoroscopy was utilized by the requesting physician.  No radiographic interpretation.    Disposition: Discharge disposition: 01-Home or Self Care         Follow-up Information    Schedule an appointment as soon as possible for a visit with Lyndle Herrlich, MD.   Specialty:  Orthopedic Surgery Why:  office to call and set up appointment with patient Contact information: [address] [place] [address] [phone]            Signed: Lyndle Herrlich ,MD 07/08/2018, 12:42 PM

## 2018-07-08 NOTE — Discharge Instructions (Signed)
Continue NON  Weightbearing on the right lower extremity.    Elevate the right lower extremity whenever possible and continue the polar care while elevating the extremity. Patient may shower. No bath or submerging the wound.    Take aspirin as directed for blood clot prevention.  Continue to work on straight leg raises at home as instructed by physical therapy. Continue to use a walker for assistance with ambulation.  Call [phone] with any questions, such as fever > 101.5 degrees, drainage from the wound or shortness of breath.

## 2018-07-08 NOTE — Progress Notes (Signed)
Pt ready for discharge home today per MD. Patient assessment unchanged from this morning. Reviewed discharge instructions and prescriptions with pt; all questions answered and pt verbalized understanding. PIV removed, VSS. EMS transportation set up for pt.     Shelbie Ammons

## 2018-07-08 NOTE — Progress Notes (Signed)
Physical Therapy Treatment Patient Details Name: Alyssa Maynard MRN: [id number] DOB: [birth year] Today's Date: 07/08/2018    History of Present Illness Pt admitted for tibial plateau fx from going up steps, caught sandal leading to hyperextension of knee and then resulting in fall. Pt is now s/p ORIF. History includes anxiety, CKD, HTN, GERD, depression, and DM. Pt very eager to participate in PT.    PT Comments    Pt reports going home today and her [facility] and w/c were delivered already and her husband took them home (so therapist unable to adjust w/c for pt); also has walker at home already.  PT educated pt on how to safely adjust w/c (for proper fit) and manage w/c (pt able to propel self with B UE's in w/c with SBA x120 feet and demonstrated and verbalized safe ability to lock brakes prior to any transfer and also to turn w/c R/L to navigate in room and hallways).  Overall pt steady with transfers using RW and demonstrating safe ability to keep NWB'ing precautions (pt educated on use of shoe on L LE to make NWB'ing R LE easier and also to improve stability with mobility).  Pain 5/10 end of session R LE (pt reporting feeling a lot better than last night).  Pt reports she has ramp to enter home.    Follow Up Recommendations  Home health PT;Supervision for mobility/OOB     Equipment Recommendations  Wheelchair (measurements PT);3in1 (PT);Rolling walker with 5" wheels    Recommendations for Other Services       Precautions / Restrictions Precautions Precautions: Fall Required Braces or Orthoses: Knee Immobilizer - Right Knee Immobilizer - Right: On at all times;Other (comment)(on at all times except when in CPM/PT) Restrictions Weight Bearing Restrictions: Yes RLE Weight Bearing: Non weight bearing    Mobility  Bed Mobility Overal bed mobility: Needs Assistance Bed Mobility: Supine to Sit;Sit to Supine     Supine to sit: Supervision Sit to supine: Supervision   General bed mobility  comments: bed flat; initial vc's for technique and then pt able to perform on own; pt using UE's to assist with moving R LE  Transfers Overall transfer level: Needs assistance Equipment used: Rolling walker (2 wheeled) Transfers: Sit to/from [employer] Sit to Stand: Min guard;Supervision Stand pivot transfers: Min guard;Supervision       General transfer comment: pt demonstrating safe technique transferring from bed to [facility] to recliner to manual w/c and then back to bed with RW  Ambulation/Gait Ambulation/Gait assistance: Min guard Gait Distance (Feet): 5 Feet(BSC to recliner) Assistive device: Rolling walker (2 wheeled)       General Gait Details: NWB R LE; steady with RW   Stairs             Wheelchair Mobility    Modified Rankin (Stroke Patients Only)       Balance Overall balance assessment: Needs assistance;History of Falls Sitting-balance support: Feet unsupported;No upper extremity supported Sitting balance-Leahy Scale: Good Sitting balance - Comments: steady sitting reaching within BOS   Standing balance support: Single extremity supported Standing balance-Leahy Scale: Poor Standing balance comment: requires at least single UE support for static standing balance                            Cognition Arousal/Alertness: Awake/alert Behavior During Therapy: WFL for tasks assessed/performed Overall Cognitive Status: Within Functional Limits for tasks assessed  Exercises      General Comments General comments (skin integrity, edema, etc.): R knee polar care and immobilizer in place upon PT arrival.  Nursing cleared pt for participation in physical therapy.  Pt agreeable to PT session.      Pertinent Vitals/Pain Pain Assessment: 0-10 Pain Score: 5  Pain Descriptors / Indicators: Constant;Aching Pain Intervention(s): Limited activity within patient's tolerance;Monitored  during session;Premedicated before session;Repositioned;Other (comment)(polar care applied and activated)    Home Living                      Prior Function            PT Goals (current goals can now be found in the care plan section) Acute Rehab PT Goals Patient Stated Goal: to go home PT Goal Formulation: With patient Time For Goal Achievement: 07/20/18 Potential to Achieve Goals: Good Additional Goals Additional Goal #1: Pt will be able to perform bed mobility/transfers with supervision and safe technique in order to improve functional mobility Progress towards PT goals: Progressing toward goals    Frequency    BID      PT Plan Current plan remains appropriate    Co-evaluation              AM-PAC PT "6 Clicks" Daily Activity  Outcome Measure  Difficulty turning over in bed (including adjusting bedclothes, sheets and blankets)?: A Little Difficulty moving from lying on back to sitting on the side of the bed? : A Lot Difficulty sitting down on and standing up from a chair with arms (e.g., wheelchair, bedside commode, etc,.)?: Unable Help needed moving to and from a bed to chair (including a wheelchair)?: A Little Help needed walking in hospital room?: A Little Help needed climbing 3-5 steps with a railing? : Total 6 Click Score: 13    End of Session Equipment Utilized During Treatment: Gait belt;Right knee immobilizer Activity Tolerance: Patient tolerated treatment well Patient left: in bed;with call bell/phone within reach;with bed alarm set Nurse Communication: Mobility status PT Visit Diagnosis: Muscle weakness (generalized) (M62.81);Difficulty in walking, not elsewhere classified (R26.2);Pain Pain - Right/Left: Right Pain - part of body: Leg     Time: 1110-1158 PT Time Calculation (min) (ACUTE ONLY): 48 min  Charges:  $Therapeutic Activity: 23-37 mins $Wheel Chair Management: 8-22 mins                    Hendricks Limes, PT 07/08/18,  12:57 PM [phone]

## 2019-03-15 DIAGNOSIS — Z6841 Body Mass Index (BMI) 40.0 and over, adult: Secondary | ICD-10-CM | POA: Insufficient documentation

## 2019-03-15 DIAGNOSIS — M791 Myalgia, unspecified site: Secondary | ICD-10-CM | POA: Insufficient documentation

## 2019-04-01 ENCOUNTER — Other Ambulatory Visit: Payer: Self-pay | Admitting: Internal Medicine

## 2019-04-01 DIAGNOSIS — Z1231 Encounter for screening mammogram for malignant neoplasm of breast: Secondary | ICD-10-CM

## 2019-05-12 ENCOUNTER — Other Ambulatory Visit: Payer: Self-pay

## 2019-05-12 ENCOUNTER — Emergency Department: Payer: 59

## 2019-05-12 ENCOUNTER — Emergency Department
Admission: EM | Admit: 2019-05-12 | Discharge: 2019-05-12 | Disposition: A | Discharge status: Home / Self Care | Payer: 59 | Attending: Emergency Medicine | Admitting: Emergency Medicine

## 2019-05-12 ENCOUNTER — Encounter: Payer: Self-pay | Admitting: Emergency Medicine

## 2019-05-12 DIAGNOSIS — Z91040 Latex allergy status: Secondary | ICD-10-CM | POA: Diagnosis not present

## 2019-05-12 DIAGNOSIS — N183 Chronic kidney disease, stage 3 (moderate): Secondary | ICD-10-CM | POA: Diagnosis not present

## 2019-05-12 DIAGNOSIS — Z79899 Other long term (current) drug therapy: Secondary | ICD-10-CM | POA: Diagnosis not present

## 2019-05-12 DIAGNOSIS — Z7984 Long term (current) use of oral hypoglycemic drugs: Secondary | ICD-10-CM | POA: Insufficient documentation

## 2019-05-12 DIAGNOSIS — Z7982 Long term (current) use of aspirin: Secondary | ICD-10-CM | POA: Diagnosis not present

## 2019-05-12 DIAGNOSIS — E1122 Type 2 diabetes mellitus with diabetic chronic kidney disease: Secondary | ICD-10-CM | POA: Diagnosis not present

## 2019-05-12 DIAGNOSIS — L03012 Cellulitis of left finger: Secondary | ICD-10-CM | POA: Diagnosis not present

## 2019-05-12 DIAGNOSIS — I129 Hypertensive chronic kidney disease with stage 1 through stage 4 chronic kidney disease, or unspecified chronic kidney disease: Secondary | ICD-10-CM | POA: Insufficient documentation

## 2019-05-12 DIAGNOSIS — F17210 Nicotine dependence, cigarettes, uncomplicated: Secondary | ICD-10-CM | POA: Diagnosis not present

## 2019-05-12 LAB — CBC WITH DIFFERENTIAL/PLATELET
Abs Immature Granulocytes: 0.08 10*3/uL — ABNORMAL HIGH (ref 0.00–0.07)
Basophils Absolute: 0 10*3/uL (ref 0.0–0.1)
Basophils Relative: 0 %
Eosinophils Absolute: 0.1 10*3/uL (ref 0.0–0.5)
Eosinophils Relative: 1 %
HCT: 39 % (ref 36.0–46.0)
Hemoglobin: 12.2 g/dL (ref 12.0–15.0)
Immature Granulocytes: 1 %
Lymphocytes Relative: 14 %
Lymphs Abs: 1.3 10*3/uL (ref 0.7–4.0)
MCH: 28.8 pg (ref 26.0–34.0)
MCHC: 31.3 g/dL (ref 30.0–36.0)
MCV: 92.2 fL (ref 80.0–100.0)
Monocytes Absolute: 0.5 10*3/uL (ref 0.1–1.0)
Monocytes Relative: 5 %
Neutro Abs: 7.5 10*3/uL (ref 1.7–7.7)
Neutrophils Relative %: 79 %
Platelets: 350 10*3/uL (ref 150–400)
RBC: 4.23 MIL/uL (ref 3.87–5.11)
RDW: 16.5 % — ABNORMAL HIGH (ref 11.5–15.5)
WBC: 9.5 10*3/uL (ref 4.0–10.5)
nRBC: 0 % (ref 0.0–0.2)

## 2019-05-12 LAB — COMPREHENSIVE METABOLIC PANEL
ALT: 21 U/L (ref 0–44)
AST: 26 U/L (ref 15–41)
Albumin: 3.6 g/dL (ref 3.5–5.0)
Alkaline Phosphatase: 91 U/L (ref 38–126)
Anion gap: 14 (ref 5–15)
BUN: 22 mg/dL — ABNORMAL HIGH (ref 6–20)
CO2: 22 mmol/L (ref 22–32)
Calcium: 9.6 mg/dL (ref 8.9–10.3)
Chloride: 101 mmol/L (ref 98–111)
Creatinine, Ser: 1.56 mg/dL — ABNORMAL HIGH (ref 0.44–1.00)
GFR calc Af Amer: 41 mL/min — ABNORMAL LOW (ref >60)
GFR calc non Af Amer: 36 mL/min — ABNORMAL LOW (ref >60)
Glucose, Bld: 316 mg/dL — ABNORMAL HIGH (ref 70–99)
Potassium: 4.1 mmol/L (ref 3.5–5.1)
Sodium: 137 mmol/L (ref 135–145)
Total Bilirubin: 0.9 mg/dL (ref 0.3–1.2)
Total Protein: 7.2 g/dL (ref 6.5–8.1)

## 2019-05-12 MED ORDER — LIDOCAINE HCL (PF) 1 % IJ SOLN
10.0000 mL | Freq: Once | INTRAMUSCULAR | Status: AC
  Administered 2019-05-12: 10 mL
  Filled 2019-05-12: qty 10

## 2019-05-12 MED ORDER — SULFAMETHOXAZOLE-TRIMETHOPRIM 800-160 MG PO TABS: 1.0000 | ORAL_TABLET | Freq: Two times a day (BID) | ORAL | 0 refills | Status: DC

## 2019-05-12 MED ORDER — CEPHALEXIN 500 MG PO CAPS: 500.0000 mg | ORAL_CAPSULE | Freq: Four times a day (QID) | ORAL | 0 refills | Status: DC

## 2019-05-12 MED ORDER — OXYCODONE-ACETAMINOPHEN 10-325 MG PO TABS: 1.0000 | ORAL_TABLET | Freq: Four times a day (QID) | ORAL | 0 refills | Status: AC | PRN

## 2019-05-12 NOTE — ED Notes (Signed)
See triage note  States she developed some swelling and tenderness to left middle finger about 1-2 weeks ago   Was seen and placed on antibiotics  States she did not get any better .Marland Kitchenwent back for recheck and states it was lanced slightly and placed on a different antibiotics   Today presents with swelling to finger with discoloration to nailbed

## 2019-05-12 NOTE — ED Provider Notes (Signed)
[facility] Emergency Department Provider Note   ____________________________________________   First MD Initiated Contact with Patient 05/12/19 [phone]     (approximate)  I have reviewed the triage vital signs and the nursing notes.   HISTORY  Chief Complaint Wound Infection   HPI Alyssa Maynard is a 58 y.o. female presents to the ED with complaint of left third finger infection.  Patient states that she went to her no clinic this morning for recheck and was told that she needed to be seen in the ED.  Patient has been seen twice and states the first time she was placed on Keflex.  When she went back her antibiotic was switched to Bactrim.  She states that at that time the area may have been lanced but no drainage.  She is continued to soak her finger in warm water.  Patient is diabetic.  She denies any fever or chills.  There is been no nausea or vomiting.  She rates her pain as a 10/10.      Past Medical History:  Diagnosis Date  . Anxiety   . Chronic kidney disease 04/2017   stage 3  . Complication of anesthesia    pt experiences difficulty breathing after anesthesia due to sleep apnea and smoking  . Depression   . Diabetes mellitus without complication (HCC)   . Dyspnea   . GERD (gastroesophageal reflux disease)   . Hypertension   . Sleep apnea     Patient Active Problem List   Diagnosis Date Noted  . Closed fracture of right proximal tibia 07/05/2018  . Tibial plateau fracture 07/04/2018  . Acute back pain with sciatica, left 07/13/2017  . Lumbar radiculopathy 05/31/2017  . Type 2 diabetes mellitus with stage 3 chronic kidney disease, without long-term current use of insulin (HCC) 04/07/2017  . Essential hypertension 04/07/2017  . Dizziness and giddiness 04/07/2017  . Prolonged Q-T interval on ECG 02/17/2017  . CKD (chronic kidney disease) stage 3, GFR 30-59 ml/min (HCC) 08/12/2016  . Mixed anxiety and depressive disorder 04/26/2015  .  Gastroesophageal reflux disease without esophagitis 04/26/2015  . Tobacco dependence 06/07/2014  . OSA (obstructive sleep apnea) 06/07/2014  . HTN (hypertension) 06/07/2014  . Depression 06/07/2014    Past Surgical History:  Procedure Laterality Date  . APPENDECTOMY    . CAROTID ANGIOGRAPHY N/A 04/27/2017   Procedure: Carotid Angiography;  Surgeon: Annice Needyew, Jason S, MD;  Location: [facility] INVASIVE CV LAB;  Service: Cardiovascular;  Laterality: N/A;  . CHOLECYSTECTOMY    . FOOT SURGERY Left   . HAND SURGERY Right   . HYSTEROSCOPY W/D&C N/A 11/02/2015   Procedure: DILATATION AND CURETTAGE /HYSTEROSCOPY;  Surgeon: Ala DachJohanna K Halfon, MD;  Location: [facility] ORS;  Service: Gynecology;  Laterality: N/A;  . HYSTEROSCOPY W/D&C    . LIVER SURGERY    . ORIF TIBIA PLATEAU Right 07/05/2018   Procedure: OPEN REDUCTION INTERNAL FIXATION (ORIF) TIBIAL PLATEAU;  Surgeon: Lyndle HerrlichBowers, James R, MD;  Location: [facility] ORS;  Service: Orthopedics;  Laterality: Right;  . TONSILLECTOMY    . UPPER GI ENDOSCOPY      Prior to Admission medications   Medication Sig Start Date End Date Taking? Authorizing Provider  aspirin 81 MG chewable tablet Chew 1 tablet (81 mg total) by mouth 2 (two) times daily. 07/08/18   Lyndle HerrlichBowers, James R, MD  cephALEXin (KEFLEX) 500 MG capsule Take 1 capsule (500 mg total) by mouth 4 (four) times daily. 05/12/19   Tommi RumpsSummers, Rhonda L, PA-C  cholecalciferol (VITAMIN D) 1000 units tablet Take 2,000 Units by mouth daily.    [provider]  docusate sodium (COLACE) 100 MG capsule Take 1 capsule (100 mg total) by mouth 2 (two) times daily. 07/08/18   Lyndle HerrlichBowers, James R, MD  escitalopram (LEXAPRO) 20 MG tablet Take 20 mg by mouth daily.    [provider]  esomeprazole (NEXIUM) 40 MG capsule Take 40 mg by mouth daily.    [provider]  glimepiride (AMARYL) 4 MG tablet Take 4 mg by mouth daily.  03/20/17   [provider]  glucose blood (ONE TOUCH ULTRA TEST) test strip TEST once daily  03/17/17   [provider]  lisinopril-hydrochlorothiazide (PRINZIDE,ZESTORETIC) 20-12.5 MG tablet Take 1 tablet by mouth daily.     [provider]  oxyCODONE (OXYCONTIN) 10 mg 12 hr tablet Take 1 tablet (10 mg total) by mouth every 12 (twelve) hours. 07/08/18   Lyndle HerrlichBowers, James R, MD  oxyCODONE-acetaminophen (PERCOCET) 10-325 MG tablet Take 1 tablet by mouth every 6 (six) hours as needed for pain. 05/12/19 05/11/20  Bridget HartshornSummers, Rhonda L, PA-C  pioglitazone (ACTOS) 30 MG tablet Take 1 tablet by mouth daily. 06/07/18   [provider]  sitaGLIPtin (JANUVIA) 50 MG tablet Take 50 mg by mouth daily.  02/21/17 07/04/18  [provider]  sulfamethoxazole-trimethoprim (BACTRIM DS) 800-160 MG tablet Take 1 tablet by mouth 2 (two) times daily. 05/12/19   Tommi RumpsSummers, Rhonda L, PA-C  tiZANidine (ZANAFLEX) 2 MG tablet 1-2 tablets at bedtime as needed for muscle spasm 05/25/17   [provider]  traMADol (ULTRAM) 50 MG tablet Take 50 mg by mouth.  05/25/17   [provider]    Allergies Latex  Family History  Problem Relation Age of Onset  . Heart attack Mother   . Stroke Mother   . Heart attack Father   . Cancer Father     Social History Social History   Tobacco Use  . Smoking status: Current Every Day Smoker    Packs/day: 1.00  . Smokeless tobacco: Never Used  Substance Use Topics  . Alcohol use: Yes    Alcohol/week: 14.0 standard drinks    Types: 14 Shots of liquor per week  . Drug use: No    Review of Systems Constitutional: No fever/chills Cardiovascular: Denies chest pain. Respiratory: Denies shortness of breath. Gastrointestinal: No abdominal pain.  No nausea, no vomiting.  Genitourinary: History of 1 kidney. Musculoskeletal: Positive left third finger pain. Skin: Positive paronychia. Neurological: Negative for headaches, focal weakness or numbness.  ____________________________________________   PHYSICAL EXAM:  VITAL SIGNS: ED Triage Vitals   Enc Vitals Group     BP 05/12/19 0849 (!) 149/79     Pulse Rate 05/12/19 0849 (!) 103     Resp --      Temp 05/12/19 0849 98.6 F (37 C)     Temp Source 05/12/19 0849 Oral     SpO2 05/12/19 0849 95 %     Weight 05/12/19 0849 279 lb (126.6 kg)     Height 05/12/19 0849 5\' 4"  (1.626 m)     Head Circumference --      Peak Flow --      Pain Score 05/12/19 0844 10     Pain Loc --      Pain Edu? --      Excl. in GC? --    Constitutional: Alert and oriented. Well appearing and in no acute distress. Eyes: Conjunctivae are normal.  Head: Atraumatic.  Neck: No stridor.   Cardiovascular: Normal rate, regular rhythm. Grossly normal heart sounds.  Good peripheral circulation. Respiratory: Normal respiratory effort.  No retractions. Lungs CTAB. Musculoskeletal: On examination of the left third digit distally there is a obvious paronychia on the lateral aspect with fluctuance and extreme pain to palpation.  There is soft tissue swelling that is concerning for an early felon.  No puncture wound or evidence of foreign bodies present.  Skin is discolored. Neurologic:  Normal speech and language. No gross focal neurologic deficits are appreciated. No gait instability. Skin:  Skin is warm, dry and intact. No rash noted. Psychiatric: Mood and affect are normal. Speech and behavior are normal.  ____________________________________________   LABS (all labs ordered are listed, but only abnormal results are displayed)  Labs Reviewed  CBC WITH DIFFERENTIAL/PLATELET - Abnormal; Notable for the following components:      Result Value   RDW 16.5 (*)    Abs Immature Granulocytes 0.08 (*)    All other components within normal limits  COMPREHENSIVE METABOLIC PANEL - Abnormal; Notable for the following components:   Glucose, Bld 316 (*)    BUN 22 (*)    Creatinine, Ser 1.56 (*)    GFR calc non Af Amer 36 (*)    GFR calc Af Amer 41 (*)    All other components within normal limits   RADIOLOGY    Official radiology report(s): Dg Finger Middle Left  Result Date: 05/12/2019 CLINICAL DATA:  Pain.  Infection. EXAM: LEFT MIDDLE FINGER 2+V COMPARISON:  No recent. FINDINGS: No acute bony or joint abnormality identified. No evidence of fracture or dislocation. No bony erosions noted. Diffuse soft tissue swelling noted. No radiopaque foreign body. IMPRESSION: Diffuse soft tissue swelling. No radiopaque foreign body. No acute bony abnormality. No bony erosive lesions noted. Electronically Signed   By: Maisie Fushomas  Register   On: 05/12/2019 09:23    ____________________________________________   PROCEDURES  Procedure(s) performed (including Critical Care):  [name].[name].Incision and Drainage  Date/Time: 05/12/2019 3:28 PM Performed by: Tommi RumpsSummers, Rhonda L, PA-C Authorized by: Tommi RumpsSummers, Rhonda L, PA-C   Consent:    Consent obtained:  Verbal   Consent given by:  Patient   Risks discussed:  Incomplete drainage and pain   Alternatives discussed:  Referral Location:    Type:  Abscess   Location:  Upper extremity   Upper extremity location:  Finger   Finger location:  L long finger Pre-procedure details:    Skin preparation:  Betadine Anesthesia (see MAR for exact dosages):    Anesthesia method:  Nerve block   Block needle gauge:  25 G   Block anesthetic:  Lidocaine 1% w/o epi   Block injection procedure:  Anatomic landmarks identified, introduced needle and negative aspiration for blood   Block outcome:  Anesthesia achieved Procedure type:    Complexity:  Simple Procedure details:    Needle aspiration: no     Incision types:  Stab incision   Incision depth:  Dermal   Scalpel blade:  11   Wound management:  Irrigated with saline and probed and deloculated   Drainage:  Purulent   Drainage amount:  Moderate   Wound treatment:  Drain placed   Packing materials:  1/4 in iodoform gauze Post-procedure details:    Patient tolerance of procedure:  Tolerated well, no immediate complications      ____________________________________________   INITIAL IMPRESSION / ASSESSMENT AND PLAN / ED COURSE  As part of my medical decision making, I reviewed the  following data within the electronic MEDICAL RECORD NUMBER Notes from prior ED visits and [place] Controlled Substance Database  58 year old female presents to the ED with history of a paronychia that is been seen at [facility] with unsuccessful I&D according to the patient.  She states that today she was there for a recheck and they sent her to the emergency department.  After discussing findings and procedure with Dr. Joni Fears patient was placed on both Bactrim and Keflex at same time along with pain medication.  During the I&D moderate amount of purulent material was drained from the area and iodoform gauze was packed.  Patient is return to the ED in 2 days to have packing removed.  She is aware that if there is not any improvement that most likely she may be hospitalized.  She is to return to the emergency department if any severe worsening of her symptoms before the 2-day period.  ____________________________________________   FINAL CLINICAL IMPRESSION(S) / ED DIAGNOSES  Final diagnoses:  Paronychia of left middle finger     ED Discharge Orders         Ordered    cephALEXin (KEFLEX) 500 MG capsule  4 times daily     05/12/19 1039    sulfamethoxazole-trimethoprim (BACTRIM DS) 800-160 MG tablet  2 times daily     05/12/19 1039    oxyCODONE-acetaminophen (PERCOCET) 10-325 MG tablet  Every 6 hours PRN     05/12/19 1039           Note:  This document was prepared using Dragon voice recognition software and may include unintentional dictation errors.    Johnn Hai, PA-C 05/12/19 1532    Carrie Mew, MD 05/18/19 [phone]

## 2019-05-12 NOTE — ED Triage Notes (Signed)
Sent from [facility] for infected finger.  Says took 7 day antibiotc.  Getting worse and she is diabetic.

## 2019-05-12 NOTE — Discharge Instructions (Signed)
Return to the emergency department in 2 days for wound recheck and also to have the drain removed from your finger.  You may use warm compresses to your finger even though you have a dressing on it.  Begin taking antibiotics as directed.  Keflex 500 mg 4 times daily for 10 days and Bactrim DS twice daily for 10 days.  Percocet 10 mg every 6 hours.  This is double the strength of what you were taking.  Be aware that this medication could cause drowsiness and increase your risk for falling.  If there is no improvement in your finger in 2 days be aware that you may be admitted.

## 2019-05-14 ENCOUNTER — Encounter: Payer: Self-pay | Admitting: Emergency Medicine

## 2019-05-14 ENCOUNTER — Other Ambulatory Visit: Payer: Self-pay

## 2019-05-14 ENCOUNTER — Emergency Department
Admission: EM | Admit: 2019-05-14 | Discharge: 2019-05-14 | Disposition: A | Discharge status: Home / Self Care | Payer: 59 | Attending: Emergency Medicine | Admitting: Emergency Medicine

## 2019-05-14 DIAGNOSIS — Z79899 Other long term (current) drug therapy: Secondary | ICD-10-CM | POA: Insufficient documentation

## 2019-05-14 DIAGNOSIS — Z5189 Encounter for other specified aftercare: Secondary | ICD-10-CM | POA: Insufficient documentation

## 2019-05-14 DIAGNOSIS — N183 Chronic kidney disease, stage 3 (moderate): Secondary | ICD-10-CM | POA: Diagnosis not present

## 2019-05-14 DIAGNOSIS — F17210 Nicotine dependence, cigarettes, uncomplicated: Secondary | ICD-10-CM | POA: Insufficient documentation

## 2019-05-14 DIAGNOSIS — Z91040 Latex allergy status: Secondary | ICD-10-CM | POA: Diagnosis not present

## 2019-05-14 DIAGNOSIS — I129 Hypertensive chronic kidney disease with stage 1 through stage 4 chronic kidney disease, or unspecified chronic kidney disease: Secondary | ICD-10-CM | POA: Diagnosis not present

## 2019-05-14 DIAGNOSIS — L03011 Cellulitis of right finger: Secondary | ICD-10-CM | POA: Diagnosis present

## 2019-05-14 DIAGNOSIS — E1122 Type 2 diabetes mellitus with diabetic chronic kidney disease: Secondary | ICD-10-CM | POA: Diagnosis not present

## 2019-05-14 DIAGNOSIS — Z7984 Long term (current) use of oral hypoglycemic drugs: Secondary | ICD-10-CM | POA: Diagnosis not present

## 2019-05-14 NOTE — Discharge Instructions (Addendum)
Keep bandage on for 2 days.  Then may do daily dressing changes with Band-Aids.  Continue antibiotics until complete.

## 2019-05-14 NOTE — ED Provider Notes (Signed)
[facility] Emergency Department Provider Note   ____________________________________________   First MD Initiated Contact with Patient 05/14/19 [phone]     (approximate)  I have reviewed the triage vital signs and the nursing notes.   HISTORY  Chief Complaint Wound Check    HPI Alyssa Maynard is a 58 y.o. female return for wound recheck secondary to I&D of paronychia third digit right hand.  Patient states tenderness at incision site.  Patient denies increased edema or erythema.  Patient rates her pain as of 3/10.         Past Medical History:  Diagnosis Date  . Anxiety   . Chronic kidney disease 04/2017   stage 3  . Complication of anesthesia    pt experiences difficulty breathing after anesthesia due to sleep apnea and smoking  . Depression   . Diabetes mellitus without complication ([place])   . Dyspnea   . GERD (gastroesophageal reflux disease)   . Hypertension   . Sleep apnea     Patient Active Problem List   Diagnosis Date Noted  . Closed fracture of right proximal tibia 07/05/2018  . Tibial plateau fracture 07/04/2018  . Acute back pain with sciatica, left 07/13/2017  . Lumbar radiculopathy 05/31/2017  . Type 2 diabetes mellitus with stage 3 chronic kidney disease, without long-term current use of insulin ([place]) 04/07/2017  . Essential hypertension 04/07/2017  . Dizziness and giddiness 04/07/2017  . Prolonged Q-T interval on ECG 02/17/2017  . CKD (chronic kidney disease) stage 3, GFR 30-59 ml/min (HCC) 08/12/2016  . Mixed anxiety and depressive disorder 04/26/2015  . Gastroesophageal reflux disease without esophagitis 04/26/2015  . Tobacco dependence 06/07/2014  . OSA (obstructive sleep apnea) 06/07/2014  . HTN (hypertension) 06/07/2014  . Depression 06/07/2014    Past Surgical History:  Procedure Laterality Date  . APPENDECTOMY    . CAROTID ANGIOGRAPHY N/A 04/27/2017   Procedure: Carotid Angiography;  Surgeon: Algernon Huxley, MD;   Location: [place] CV LAB;  Service: Cardiovascular;  Laterality: N/A;  . CHOLECYSTECTOMY    . FOOT SURGERY Left   . HAND SURGERY Right   . HYSTEROSCOPY W/D&C N/A 11/02/2015   Procedure: DILATATION AND CURETTAGE /HYSTEROSCOPY;  Surgeon: Lorette Ang, MD;  Location: [facility] ORS;  Service: Gynecology;  Laterality: N/A;  . HYSTEROSCOPY W/D&C    . LIVER SURGERY    . ORIF TIBIA PLATEAU Right 07/05/2018   Procedure: OPEN REDUCTION INTERNAL FIXATION (ORIF) TIBIAL PLATEAU;  Surgeon: Lovell Sheehan, MD;  Location: [facility] ORS;  Service: Orthopedics;  Laterality: Right;  . TONSILLECTOMY    . UPPER GI ENDOSCOPY      Prior to Admission medications   Medication Sig Start Date End Date Taking? Authorizing Provider  aspirin 81 MG chewable tablet Chew 1 tablet (81 mg total) by mouth 2 (two) times daily. 07/08/18   Lovell Sheehan, MD  cephALEXin (KEFLEX) 500 MG capsule Take 1 capsule (500 mg total) by mouth 4 (four) times daily. 05/12/19   Johnn Hai, PA-C  cholecalciferol (VITAMIN D) 1000 units tablet Take 2,000 Units by mouth daily.    [provider]  docusate sodium (COLACE) 100 MG capsule Take 1 capsule (100 mg total) by mouth 2 (two) times daily. 07/08/18   Lovell Sheehan, MD  escitalopram (LEXAPRO) 20 MG tablet Take 20 mg by mouth daily.    [provider]  esomeprazole (NEXIUM) 40 MG capsule Take 40 mg by mouth daily.    [provider]  glimepiride (AMARYL) 4 MG tablet Take 4 mg by mouth daily.  03/20/17   [provider]  glucose blood (ONE TOUCH ULTRA TEST) test strip TEST once daily 03/17/17   [provider]  lisinopril-hydrochlorothiazide (PRINZIDE,ZESTORETIC) 20-12.5 MG tablet Take 1 tablet by mouth daily.     [provider]  oxyCODONE (OXYCONTIN) 10 mg 12 hr tablet Take 1 tablet (10 mg total) by mouth every 12 (twelve) hours. 07/08/18   Lyndle HerrlichBowers, James R, MD  oxyCODONE-acetaminophen (PERCOCET) 10-325 MG tablet Take 1 tablet by mouth every  6 (six) hours as needed for pain. 05/12/19 05/11/20  Bridget HartshornSummers, Rhonda L, PA-C  pioglitazone (ACTOS) 30 MG tablet Take 1 tablet by mouth daily. 06/07/18   [provider]  sitaGLIPtin (JANUVIA) 50 MG tablet Take 50 mg by mouth daily.  02/21/17 07/04/18  [provider]  sulfamethoxazole-trimethoprim (BACTRIM DS) 800-160 MG tablet Take 1 tablet by mouth 2 (two) times daily. 05/12/19   Tommi RumpsSummers, Rhonda L, PA-C  tiZANidine (ZANAFLEX) 2 MG tablet 1-2 tablets at bedtime as needed for muscle spasm 05/25/17   [provider]  traMADol (ULTRAM) 50 MG tablet Take 50 mg by mouth.  05/25/17   [provider]    Allergies Latex  Family History  Problem Relation Age of Onset  . Heart attack Mother   . Stroke Mother   . Heart attack Father   . Cancer Father     Social History Social History   Tobacco Use  . Smoking status: Current Every Day Smoker    Packs/day: 1.00  . Smokeless tobacco: Never Used  Substance Use Topics  . Alcohol use: Yes    Alcohol/week: 14.0 standard drinks    Types: 14 Shots of liquor per week  . Drug use: No    Review of Systems  Constitutional: No fever/chills Eyes: No visual changes. ENT: No sore throat. Cardiovascular: Denies chest pain. Respiratory: Denies shortness of breath. Gastrointestinal: No abdominal pain.  No nausea, no vomiting.  No diarrhea.  No constipation. Genitourinary: Negative for dysuria. Musculoskeletal: Negative for back pain. Skin: Negative for rash. Neurological: Negative for headaches, focal weakness or numbness. Psychiatric:  Anxiety Endocrine:  Diabetes and hypertension Allergic/Immunilogical: Latex ____________________________________________   PHYSICAL EXAM:  VITAL SIGNS: ED Triage Vitals  Enc Vitals Group     BP 05/14/19 0802 139/67     Pulse Rate 05/14/19 0802 95     Resp 05/14/19 0802 20     Temp 05/14/19 0802 98.5 F (36.9 C)     Temp Source 05/14/19 0802 Oral     SpO2 05/14/19 0802 94 %      Weight 05/14/19 0806 187 lb (84.8 kg)     Height 05/14/19 0806 5\' 5"  (1.651 m)     Head Circumference --      Peak Flow --      Pain Score --      Pain Loc --      Pain Edu? --      Excl. in GC? --    Constitutional: Alert and oriented. Well appearing and in no acute distress.  Anxious. Cardiovascular: Normal rate, regular rhythm. Grossly normal heart sounds.  Good peripheral circulation. Respiratory: Normal respiratory effort.  No retractions. Lungs CTAB. Skin: Mild erythema no erythema to the distal left third digit.  Packing material in place with no active drainage. Psychiatric: Mood and affect are normal. Speech and behavior are normal.  ____________________________________________   LABS (all labs ordered are listed, but  only abnormal results are displayed)  Labs Reviewed - No data to display ____________________________________________  EKG   ____________________________________________  RADIOLOGY  ED MD interpretation:    Official radiology report(s): No results found.  ____________________________________________   PROCEDURES  Procedure(s) performed (including Critical Care):  Procedures   ____________________________________________   INITIAL IMPRESSION / ASSESSMENT AND PLAN / ED COURSE  As part of my medical decision making, I reviewed the following data within the electronic MEDICAL RECORD NUMBER         Stasia CavalierDonna S Lax was evaluated in Emergency Department on 05/14/2019 for the symptoms described in the history of present illness. She was evaluated in the context of the global COVID-19 pandemic, which necessitated consideration that the patient might be at risk for infection with the SARS-CoV-2 virus that causes COVID-19. Institutional protocols and algorithms that pertain to the evaluation of patients at risk for COVID-19 are in a state of rapid change based on information released by regulatory bodies including the CDC and federal and state organizations.  These policies and algorithms were followed during the patient's care in the ED.   Patient presents for reevaluation/wound check of paronychia incision and drainage 2 days ago.  Patient voices no complaint.  Area was clean and packed material removed with no active drainage.  Wound was irrigated and re-bandaged.  Patient given discharge care instruction advised continue previous medication.  Return back to ED if condition worsens.      ____________________________________________   FINAL CLINICAL IMPRESSION(S) / ED DIAGNOSES  Final diagnoses:  Encounter for wound re-check     ED Discharge Orders    None       Note:  This document was prepared using Dragon voice recognition software and may include unintentional dictation errors.    Joni ReiningSmith, Ronald K, PA-C 05/14/19 [id number]    Shaune PollackIsaacs, [place], MD 05/15/19 [id number]

## 2019-05-14 NOTE — ED Triage Notes (Signed)
Was seen for finger infection. I and D and packing 2 days ago. PA advised return for wound check today.

## 2019-06-14 ENCOUNTER — Ambulatory Visit
Admission: RE | Admit: 2019-06-14 | Discharge: 2019-06-14 | Disposition: A | Discharge status: Home / Self Care | Payer: 59 | Source: Ambulatory Visit | Attending: Internal Medicine | Admitting: Internal Medicine

## 2019-06-14 ENCOUNTER — Other Ambulatory Visit: Payer: Self-pay

## 2019-06-14 DIAGNOSIS — Z1231 Encounter for screening mammogram for malignant neoplasm of breast: Secondary | ICD-10-CM | POA: Diagnosis not present

## 2019-07-29 DIAGNOSIS — N2581 Secondary hyperparathyroidism of renal origin: Secondary | ICD-10-CM | POA: Insufficient documentation

## 2019-08-09 ENCOUNTER — Other Ambulatory Visit: Payer: Self-pay | Admitting: Internal Medicine

## 2019-08-09 DIAGNOSIS — I739 Peripheral vascular disease, unspecified: Secondary | ICD-10-CM

## 2019-08-09 DIAGNOSIS — R0989 Other specified symptoms and signs involving the circulatory and respiratory systems: Secondary | ICD-10-CM

## 2019-08-10 ENCOUNTER — Encounter (INDEPENDENT_AMBULATORY_CARE_PROVIDER_SITE_OTHER): Payer: Self-pay

## 2019-08-10 ENCOUNTER — Ambulatory Visit
Admission: RE | Admit: 2019-08-10 | Discharge: 2019-08-10 | Disposition: A | Discharge status: Home / Self Care | Payer: 59 | Source: Ambulatory Visit | Attending: Internal Medicine | Admitting: Internal Medicine

## 2019-08-10 ENCOUNTER — Other Ambulatory Visit: Payer: Self-pay

## 2019-08-10 DIAGNOSIS — I739 Peripheral vascular disease, unspecified: Secondary | ICD-10-CM

## 2019-08-10 DIAGNOSIS — R0989 Other specified symptoms and signs involving the circulatory and respiratory systems: Secondary | ICD-10-CM

## 2019-08-10 HISTORY — DX: Disorder of kidney and ureter, unspecified: N28.9

## 2019-08-10 MED ORDER — IOHEXOL 350 MG/ML SOLN
80.0000 mL | Freq: Once | INTRAVENOUS | Status: AC | PRN
  Administered 2019-08-10: 80 mL via INTRAVENOUS

## 2019-08-10 MED ORDER — IOHEXOL 350 MG/ML SOLN
100.0000 mL | Freq: Once | INTRAVENOUS | Status: AC | PRN
  Administered 2019-08-10: 80 mL via INTRAVENOUS

## 2019-08-16 DIAGNOSIS — I771 Stricture of artery: Secondary | ICD-10-CM | POA: Insufficient documentation

## 2019-08-16 DIAGNOSIS — I2510 Atherosclerotic heart disease of native coronary artery without angina pectoris: Secondary | ICD-10-CM | POA: Insufficient documentation

## 2019-08-30 ENCOUNTER — Ambulatory Visit (INDEPENDENT_AMBULATORY_CARE_PROVIDER_SITE_OTHER): Payer: 59 | Admitting: Vascular Surgery

## 2019-08-30 ENCOUNTER — Telehealth (INDEPENDENT_AMBULATORY_CARE_PROVIDER_SITE_OTHER): Payer: Self-pay

## 2019-08-30 ENCOUNTER — Encounter (INDEPENDENT_AMBULATORY_CARE_PROVIDER_SITE_OTHER): Payer: Self-pay | Admitting: Vascular Surgery

## 2019-08-30 ENCOUNTER — Other Ambulatory Visit: Payer: Self-pay

## 2019-08-30 VITALS — BP 151/75 | HR 89 | Resp 16 | Wt 285.0 lb

## 2019-08-30 DIAGNOSIS — E1122 Type 2 diabetes mellitus with diabetic chronic kidney disease: Secondary | ICD-10-CM

## 2019-08-30 DIAGNOSIS — I10 Essential (primary) hypertension: Secondary | ICD-10-CM

## 2019-08-30 DIAGNOSIS — N183 Chronic kidney disease, stage 3 unspecified: Secondary | ICD-10-CM | POA: Diagnosis not present

## 2019-08-30 DIAGNOSIS — I771 Stricture of artery: Secondary | ICD-10-CM | POA: Diagnosis not present

## 2019-08-30 DIAGNOSIS — F17200 Nicotine dependence, unspecified, uncomplicated: Secondary | ICD-10-CM

## 2019-08-30 DIAGNOSIS — Z720 Tobacco use: Secondary | ICD-10-CM | POA: Diagnosis not present

## 2019-08-30 DIAGNOSIS — F17210 Nicotine dependence, cigarettes, uncomplicated: Secondary | ICD-10-CM | POA: Diagnosis not present

## 2019-08-30 NOTE — Telephone Encounter (Signed)
Spoke with the patient and she is now scheduled with Dr. Lucky Cowboy for angio on 09/08/2019

## 2019-08-30 NOTE — Telephone Encounter (Signed)
Patient scheduled with Dr. Lucky Cowboy for angio on 09/08/2019 with a 7:45 am arrival time to the [facility]. Patient will do Covid testing on 09/06/2019 between 12:30-2:30 pm at the [place]. Pre-procedure instructions were discussed and will be mailed to the patient.

## 2019-08-30 NOTE — Assessment & Plan Note (Signed)
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 Given the fact she has now had infection and ulceration on her fingertips as well as left arm pain, this will clearly require further evaluation and potential intervention.  I have recommended an angiogram be performed with the intent to perform intervention at the same time if possible and appropriate.  I have discussed generally we come from a femoral approach but at times we need to come from a radial approach to address these occlusions.  I have also discussed the possibility of carotid subclavian bypass being performed.  I have discussed the absolute need for smoking cessation and control of her medical risk factors.  She understands an angiogram will be scheduled in the near future at her convenience.

## 2019-08-30 NOTE — Progress Notes (Signed)
MRN : [id number]  Alyssa Maynard is a 58 y.o. (1961-10-31) female who presents with chief complaint of  Chief Complaint  Patient presents with  . Follow-up    ref [name] for subclavian arterial stenosis  .  History of Present Illness: Patient returns today on a new referral from her primary care physician for left subclavian artery stenosis.  I saw the patient about 2-1/2 years ago for left vertebral artery occlusion.  At that time, her left subclavian artery disease was not significant.  Since that time, it has been noted that her left arm blood pressure is significantly lower than the right.  She has been having some pain in that left arm.  More concerning is the fact that she had skin breakdown and infection of her left third finger and then later her left fifth finger the which were very poor to heal.  She reports no trauma, injury, or other inciting event that started the symptoms.  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  Current Outpatient Medications  Medication Sig Dispense Refill  . albuterol (PROAIR HFA) 108 (90 Base) MCG/ACT inhaler once daily as needed. Reported on 10/26/2015    . B-D ULTRA-FINE 33 LANCETS MISC Use 1 Device once daily.    [name] escitalopram (LEXAPRO) 20 MG tablet Take 20 mg by mouth daily.    [name] esomeprazole (NEXIUM) 40 MG capsule Take 40 mg by mouth daily.    [name] glimepiride (AMARYL) 4 MG tablet Take 4 mg by mouth daily.   0  . glucose blood (ONE TOUCH ULTRA TEST) test strip TEST once daily    . lisinopril-hydrochlorothiazide (PRINZIDE,ZESTORETIC) 20-12.5 MG tablet Take 1 tablet by mouth daily.     . pioglitazone (ACTOS) 30 MG tablet Take 1 tablet by mouth daily.  1  . tiZANidine (ZANAFLEX) 2 MG tablet 1-2 tablets at bedtime as needed for muscle spasm    . aspirin 81 MG chewable tablet Chew 1 tablet  (81 mg total) by mouth 2 (two) times daily. (Patient not taking: Reported on 08/30/2019) 60 tablet 0  . cephALEXin (KEFLEX) 500 MG capsule Take 1 capsule (500 mg total) by mouth 4 (four) times daily. (Patient not taking: Reported on 08/30/2019) 40 capsule 0  . cholecalciferol (VITAMIN D) 1000 units tablet Take 2,000 Units by mouth daily.    [name] docusate sodium (COLACE) 100 MG capsule Take 1 capsule (100 mg total) by mouth 2 (two) times daily. (Patient not taking: Reported on 08/30/2019) 10 capsule 0  . oxyCODONE (OXYCONTIN) 10 mg 12 hr tablet Take 1 tablet (10 mg total) by mouth every 12 (twelve) hours. (Patient not taking: Reported on 08/30/2019) 10 tablet 0  . oxyCODONE-acetaminophen (PERCOCET) 10-325 MG tablet Take 1 tablet by mouth every 6 (six) hours as needed for pain. (Patient not taking: Reported on 08/30/2019) 20 tablet 0  . sitaGLIPtin (JANUVIA) 50 MG tablet Take 50 mg by mouth daily.     [name] sulfamethoxazole-trimethoprim (BACTRIM DS) 800-160 MG tablet Take 1 tablet by mouth 2 (two) times daily. (Patient not taking: Reported on 08/30/2019) 20 tablet 0  . traMADol (ULTRAM) 50 MG tablet Take 50 mg by mouth.      No current facility-administered medications for this visit.     Past Medical History:  Diagnosis Date  . Anxiety   . Chronic kidney disease 04/2017  stage 3  . Complication of anesthesia    pt experiences difficulty breathing after anesthesia due to sleep apnea and smoking  . Depression   . Diabetes mellitus without complication ([place])   . Dyspnea   . GERD (gastroesophageal reflux disease)   . Hypertension   . Renal insufficiency   . Sleep apnea     Past Surgical History:  Procedure Laterality Date  . APPENDECTOMY    . CAROTID ANGIOGRAPHY N/A 04/27/2017   Procedure: Carotid Angiography;  Surgeon: Algernon Huxley, MD;  Location: [facility];  Service: Cardiovascular;  Laterality: N/A;  . CHOLECYSTECTOMY    . FOOT SURGERY Left   . HAND SURGERY Right   .  HYSTEROSCOPY W/D&C N/A 11/02/2015   Procedure: DILATATION AND CURETTAGE /HYSTEROSCOPY;  Surgeon: Lorette Ang, MD;  Location: [facility];  Service: Gynecology;  Laterality: N/A;  . HYSTEROSCOPY W/D&C    . LIVER SURGERY    . ORIF TIBIA PLATEAU Right 07/05/2018   Procedure: OPEN REDUCTION INTERNAL FIXATION (ORIF) TIBIAL PLATEAU;  Surgeon: Lovell Sheehan, MD;  Location: [facility];  Service: Orthopedics;  Laterality: Right;  . TONSILLECTOMY    . UPPER GI ENDOSCOPY      Social History Social History   Tobacco Use  . Smoking status: Current Every Day Smoker    Packs/day: 1.00  . Smokeless tobacco: Never Used  Substance Use Topics  . Alcohol use: Yes    Alcohol/week: 14.0 standard drinks    Types: 14 Shots of liquor per week  . Drug use: No     Family History Family History  Problem Relation Age of Onset  . Heart attack Mother   . Stroke Mother   . Heart attack Father   . Cancer Father   No bleeding or clotting disorders   Allergies  Allergen Reactions  . Latex Rash and Other (See Comments)    Burns skin     REVIEW OF SYSTEMS (Negative unless checked)  Constitutional: [] ?Weight loss  [] ?Fever  [] ?Chills Cardiac: [] ?Chest pain   [] ?Chest pressure   [] ?Palpitations   [] ?Shortness of breath when laying flat   [] ?Shortness of breath at rest   [] ?Shortness of breath with exertion. Vascular:  [] ?Pain in legs with walking   [] ?Pain in legs at rest   [] ?Pain in legs when laying flat   [] ?Claudication   [] ?Pain in feet when walking  [] ?Pain in feet at rest  [] ?Pain in feet when laying flat   [] ?History of DVT   [] ?Phlebitis   [] ?Swelling in legs   [] ?Varicose veins   [] ?Non-healing ulcers Pulmonary:   [] ?Uses home oxygen   [] ?Productive cough   [] ?Hemoptysis   [] ?Wheeze  [] ?COPD   [] ?Asthma Neurologic:  [x] ?Dizziness  [] ?Blackouts   [] ?Seizures   [] ?History of stroke   [] ?History of TIA  [] ?Aphasia   [] ?Temporary blindness   [] ?Dysphagia   [x] ?Weakness or numbness in arms    [] ?Weakness or numbness in legs Musculoskeletal:  [x] ?Arthritis   [] ?Joint swelling   [] ?Joint pain   [] ?Low back pain Hematologic:  [] ?Easy bruising  [] ?Easy bleeding   [] ?Hypercoagulable state   [] ?Anemic  [] ?Hepatitis Gastrointestinal:  [] ?Blood in stool   [] ?Vomiting blood  [x] ?Gastroesophageal reflux/heartburn   [] ?Abdominal pain Genitourinary:  [] ?Chronic kidney disease   [] ?Difficult urination  [] ?Frequent urination  [] ?Burning with urination   [] ?Hematuria Skin:  [] ?Rashes   [x] ?Ulcers   [x] ?Wounds Psychological:  [x] ?History of anxiety   [x] ? History of major depression.    Physical  Examination  BP (!) 151/75 (BP Location: Right Arm)   Pulse 89   Resp 16   Wt 285 lb (129.3 kg)   LMP 08/27/2015 (LMP Unknown) Comment: abnormal bleeding  BMI 47.43 kg/m  Gen:  WD/WN, NAD.  Obese Head: [place]/AT, No temporalis wasting. Ear/Nose/Throat: Hearing grossly intact, nares w/o erythema or drainage Eyes: Conjunctiva clear. Sclera non-icteric Neck: Supple.  Trachea midline Pulmonary:  Good air movement, no use of accessory muscles.  Cardiac: RRR, no JVD Vascular:  Vessel Right Left  Radial  2+ palpable  trace palpable                                   Gastrointestinal: soft, non-tender/non-distended. No guarding/reflex.  Musculoskeletal: M/S 5/5 throughout.  No deformity or atrophy.  Trace lower extremity edema. Neurologic: Sensation grossly intact in extremities.  Symmetrical.  Speech is fluent.  Psychiatric: Judgment intact, Mood & affect appropriate for pt's clinical situation. Dermatologic: No rashes or ulcers noted.  No cellulitis or open wounds.       Labs No results found for this or any previous visit (from the past 2160 hour(s)).  Radiology Ct Angio Up Extrem Left W &/or Wo Contast  Result Date: 08/10/2019 CLINICAL DATA:  58 year old female with no pulse in the left arm EXAM: CT ANGIOGRAPHY UPPER LEFT EXTREMITY TECHNIQUE: Axial spiral CT images of the left upper  extremity were acquired with the arm at the patient's side after administration of standard contrast bolus. Reformatted images were performed in coronal and sagittal planes on a separate workstation. CONTRAST:  97mL OMNIPAQUE IOHEXOL 350 MG/ML SOLN COMPARISON:  None. FINDINGS: Vascular: Cardiac: Heart size within normal limits. No pericardial fluid/thickening. Coronary calcifications are present. The study is not cardiac gated, with incidental imaging of the heart. There are no large filling defects identified. Aorta: Unremarkable course caliber and contour of the visualized thoracic aorta with mild atherosclerotic changes. Two vessel arch, with a common origin of the innominate artery and the left common carotid artery. Left subclavian artery: Left subclavian artery demonstrates advanced atherosclerotic changes at the origin, predominantly soft plaque, with minimal calcified plaque. There appears to be a flow channel maintained, however, evaluation of this region is limited by the patient's body habitus and the field of view. Left subclavian artery remains patent. Axillary artery is patent. Lateral thoracic artery patent as well as the circumflex humeral artery. Left subclavian artery appears patent, with decreased fidelity of the exam given the field of view and the patient body habitus. Brachial artery appears patent to the elbow. Evaluation of the ulnar artery, radial artery, and the interosseous branches is somewhat limited. Nonvascular and incidentally imaged regions: Unremarkable appearance of the left lung. Liver steatosis. Changes of cholecystectomy. Unremarkable spleen.  Unremarkable pancreas. Unremarkable bilateral kidneys. Unremarkable visualized aspects of the small bowel and colon. Unremarkable stomach. Unremarkable the visualized pelvic organs. No acute displaced fracture. Review of the MIP images confirms the above findings. IMPRESSION: High-grade stenosis versus occlusion of the left subclavian  artery origin secondary to atherosclerotic changes. Patent left subclavian artery beyond the atherosclerotic plaque either due to a maintained stenotic flow channel versus reconstitution from collateral flow. Patent axillary artery and brachial artery to the elbow. Beyond the elbow, the fidelity of the exam is limited by body habitus and the field of view, and a precise assessment of the left forearm arteries cannot be performed. Correlation with directed duplex and/or wrist Doppler study  may be useful. Aortic Atherosclerosis (ICD10-I70.0). Associated coronary artery disease. Additional ancillary findings as above. Electronically Signed   By: Gilmer MorJaime  Wagner D.O.   On: 08/10/2019 13:32    Assessment/Plan Diabetes (HCC) blood glucose control important in reducing the progression of atherosclerotic disease. Also, involved in wound healing. On appropriate medications.   Essential hypertension, benign blood pressure control important in reducing the progression of atherosclerotic disease. On appropriate oral medications.  CKD (chronic kidney disease) stage 3, GFR 30-59 ml/min We will hydrate and try to limit contrast with her left subclavian artery angiogram  Subclavian arterial stenosis (HCC) She underwent a CT angiogram which I have reviewed which demonstrates either an occlusion or high-grade stenosis of the proximal left subclavian artery with reconstitution couple of centimeters beyond the origin. Given the fact she has now had infection and ulceration on her fingertips as well as left arm pain, this will clearly require further evaluation and potential intervention.  I have recommended an angiogram be performed with the intent to perform intervention at the same time if possible and appropriate.  I have discussed generally we come from a femoral approach but at times we need to come from a radial approach to address these occlusions.  I have also discussed the possibility of carotid subclavian  bypass being performed.  I have discussed the absolute need for smoking cessation and control of her medical risk factors.  She understands an angiogram will be scheduled in the near future at her convenience.  Tobacco use disorder We had a discussion for approximately 3 minutes regarding the absolute need for smoking cessation due to the deleterious nature of tobacco on the vascular system. We discussed the tobacco use would diminish patency of any intervention, and likely significantly worsen progressio of disease. We discussed multiple agents for quitting including replacement therapy or medications to reduce cravings such as Chantix. The patient voices their understanding of the importance of smoking cessation.     Festus BarrenJason Caran Storck, MD  08/30/2019 10:34 AM    This note was created with Dragon medical transcription system.  Any errors from dictation are purely unintentional

## 2019-08-30 NOTE — Patient Instructions (Signed)
Angiogram  An angiogram is a procedure used to examine the blood vessels. In this procedure, contrast dye is injected through a long, thin tube (catheter) into an artery. X-rays are then taken, which show if there is a blockage or problem in a blood vessel. The catheter may be inserted in:  Your groin area. This is the most common.  The fold of your arm, near your elbow.  Your wrist. Tell a health care provider about:  Any allergies you have, including allergies to shellfish or contrast dye.  All medicines you are taking, including vitamins, herbs, eye drops, creams, and over-the-counter medicines.  Any problems you or family members have had with anesthetic medicines.  Any blood disorders you have.  Any surgeries you have had.  Any previous kidney problems or failure you have had.  Any medical conditions you have.  Whether you are pregnant or may be pregnant.  Whether you are breastfeeding. What are the risks? Generally, this is a safe procedure. However, problems may occur, including:  Infection or bruising at the catheter area.  Damage to other structures or organs, including rupture of blood vessels or damage to arteries.  Allergic reaction to the contrast dye used.  Kidney damage from the contrast dye used.  Blood clots that can lead to a stroke or heart attack. What happens before the procedure? Staying hydrated Follow instructions from your health care provider about hydration, which may include:  Up to 2 hours before the procedure - you may continue to drink clear liquids, such as water, clear fruit juice, black coffee, and plain tea. Eating and drinking restrictions Follow instructions from your health care provider about eating and drinking, which may include:  8 hours before the procedure - stop eating heavy meals or foods such as meat, fried foods, or fatty foods.  6 hours before the procedure - stop eating light meals or foods, such as toast or cereal.   6 hours before the procedure - stop drinking milk or drinks that contain milk.  2 hours before the procedure - stop drinking clear liquids. General instructions  Ask your health care provider about: ? Changing or stopping your normal medicines. This is important if you take diabetes medicines or blood thinners. ? Taking medicines such as aspirin and ibuprofen. These medicines can thin your blood. Do not take these medicines before your procedure if your doctor tells you not to.  You may have blood samples taken.  Plan to have someone take you home from the hospital or clinic.  If you will be going home right after the procedure, plan to have someone with you for 24 hours. What happens during the procedure?  To reduce your risk of infection: ? Your health care team will wash or sanitize their hands. ? Your skin will be washed with soap. ? Hair may be removed from the insertion area.  You will lie on your back on an X-ray table. You may be strapped to the table if it is tilted.  An IV tube will be inserted into one of your veins.  Electrodes may be placed on your chest to monitor your heart rate during the procedure.  You will be given one or more of the following: ? A medicine to help you relax (sedative). ? A medicine to numb the area where the catheter will be inserted (local anesthetic).  The catheter will be inserted into an artery using a guide wire. A type of X-ray (fluoroscopy) will be used to help   guide the catheter to the blood vessel to be examined.  A contrast dye will then be injected into the catheter, and X-rays will be taken. The contrast will help to show where any narrowing or blockages are located in the blood vessels. You may feel flushed as the contrast dye is injected.  After the X-ray is complete, the catheter will be removed.  A bandage (dressing) will be placed over the site where the catheter was inserted. Pressure will be applied to help stop any  bleeding. The procedure may vary among health care providers and hospitals. What happens after the procedure?  Your blood pressure, heart rate, breathing rate, and blood oxygen level will be monitored until the medicines you were given have worn off.  You will be kept in bed lying flat for several hours. If the catheter was inserted through your leg, you will be instructed not to bend or cross your legs.  The insertion area and the pulse in your feet or wrist will be checked frequently.  You will be instructed to drink plenty of fluids. This will help wash the contrast dye out of your body.  Additional blood tests and X-rays may be done.  Tests to check the electrical activity in your heart (electrocardiogram) may be done.  Do not drive for 24 hours if you received a sedative.  It is up to you to get the results of your procedure. Ask your health care provider, or the department that is doing the procedure, when your results will be ready. Summary  An angiogram is a procedure used to examine the blood vessels.  In this procedure, contrast dye is injected through a long, thin tube (catheter) into an artery. X-rays are then taken.  Before the procedure, follow your health care provider's instructions about eating and drinking restrictions. You may be asked to stop eating and drinking several hours before the procedure.  After the procedure, you will need to lie flat for several hours and drink plenty of fluids. This information is not intended to replace advice given to you by your health care provider. Make sure you discuss any questions you have with your health care provider. Document Released: 07/30/2005 Document Revised: 10/02/2017 Document Reviewed: 11/26/2016 Elsevier Patient Education  2020 [employer]  

## 2019-08-30 NOTE — Assessment & Plan Note (Signed)

## 2019-08-30 NOTE — Assessment & Plan Note (Signed)
We will hydrate and try to limit contrast with her left subclavian artery angiogram

## 2019-09-05 ENCOUNTER — Other Ambulatory Visit: Admission: RE | Admit: 2019-09-05 | Payer: 59 | Source: Ambulatory Visit

## 2019-09-06 ENCOUNTER — Other Ambulatory Visit
Admission: RE | Admit: 2019-09-06 | Discharge: 2019-09-06 | Disposition: A | Discharge status: Home / Self Care | Payer: 59 | Source: Ambulatory Visit | Attending: Vascular Surgery | Admitting: Vascular Surgery

## 2019-09-06 ENCOUNTER — Other Ambulatory Visit: Payer: Self-pay

## 2019-09-06 ENCOUNTER — Other Ambulatory Visit (INDEPENDENT_AMBULATORY_CARE_PROVIDER_SITE_OTHER): Payer: Self-pay | Admitting: Nurse Practitioner

## 2019-09-06 DIAGNOSIS — Z20828 Contact with and (suspected) exposure to other viral communicable diseases: Secondary | ICD-10-CM | POA: Diagnosis not present

## 2019-09-06 DIAGNOSIS — Z01812 Encounter for preprocedural laboratory examination: Secondary | ICD-10-CM | POA: Diagnosis present

## 2019-09-06 LAB — SARS CORONAVIRUS 2 (TAT 6-24 HRS): SARS Coronavirus 2: NEGATIVE

## 2019-09-07 MED ORDER — CEFAZOLIN SODIUM-DEXTROSE 2-4 GM/100ML-% IV SOLN
2.0000 g | Freq: Once | INTRAVENOUS | Status: AC
  Administered 2019-09-08: 2 g via INTRAVENOUS

## 2019-09-08 ENCOUNTER — Encounter
Admission: RE | Disposition: A | Discharge status: Home / Self Care | Payer: Self-pay | Source: Home / Self Care | Attending: Vascular Surgery

## 2019-09-08 ENCOUNTER — Encounter: Payer: Self-pay | Admitting: *Deleted

## 2019-09-08 ENCOUNTER — Other Ambulatory Visit: Payer: Self-pay

## 2019-09-08 ENCOUNTER — Ambulatory Visit
Admission: RE | Admit: 2019-09-08 | Discharge: 2019-09-08 | Disposition: A | Discharge status: Home / Self Care | Payer: 59 | Attending: Vascular Surgery | Admitting: Vascular Surgery

## 2019-09-08 DIAGNOSIS — Z79899 Other long term (current) drug therapy: Secondary | ICD-10-CM | POA: Insufficient documentation

## 2019-09-08 DIAGNOSIS — M79602 Pain in left arm: Secondary | ICD-10-CM | POA: Insufficient documentation

## 2019-09-08 DIAGNOSIS — F419 Anxiety disorder, unspecified: Secondary | ICD-10-CM | POA: Diagnosis not present

## 2019-09-08 DIAGNOSIS — F17210 Nicotine dependence, cigarettes, uncomplicated: Secondary | ICD-10-CM | POA: Diagnosis not present

## 2019-09-08 DIAGNOSIS — Z7984 Long term (current) use of oral hypoglycemic drugs: Secondary | ICD-10-CM | POA: Insufficient documentation

## 2019-09-08 DIAGNOSIS — G4730 Sleep apnea, unspecified: Secondary | ICD-10-CM | POA: Insufficient documentation

## 2019-09-08 DIAGNOSIS — I771 Stricture of artery: Secondary | ICD-10-CM

## 2019-09-08 DIAGNOSIS — F329 Major depressive disorder, single episode, unspecified: Secondary | ICD-10-CM | POA: Insufficient documentation

## 2019-09-08 DIAGNOSIS — E1122 Type 2 diabetes mellitus with diabetic chronic kidney disease: Secondary | ICD-10-CM | POA: Diagnosis not present

## 2019-09-08 DIAGNOSIS — K219 Gastro-esophageal reflux disease without esophagitis: Secondary | ICD-10-CM | POA: Insufficient documentation

## 2019-09-08 DIAGNOSIS — N183 Chronic kidney disease, stage 3 unspecified: Secondary | ICD-10-CM | POA: Insufficient documentation

## 2019-09-08 DIAGNOSIS — Z7982 Long term (current) use of aspirin: Secondary | ICD-10-CM | POA: Insufficient documentation

## 2019-09-08 DIAGNOSIS — I130 Hypertensive heart and chronic kidney disease with heart failure and stage 1 through stage 4 chronic kidney disease, or unspecified chronic kidney disease: Secondary | ICD-10-CM | POA: Insufficient documentation

## 2019-09-08 DIAGNOSIS — I708 Atherosclerosis of other arteries: Secondary | ICD-10-CM

## 2019-09-08 HISTORY — PX: UPPER EXTREMITY ANGIOGRAPHY: CATH118270

## 2019-09-08 LAB — BUN: BUN: 21 mg/dL — ABNORMAL HIGH (ref 6–20)

## 2019-09-08 LAB — GLUCOSE, CAPILLARY
Glucose-Capillary: 154 mg/dL — ABNORMAL HIGH (ref 70–99)
Glucose-Capillary: 85 mg/dL (ref 70–99)

## 2019-09-08 LAB — CREATININE, SERUM
Creatinine, Ser: 1.23 mg/dL — ABNORMAL HIGH (ref 0.44–1.00)
GFR calc Af Amer: 55 mL/min — ABNORMAL LOW (ref >60)
GFR calc non Af Amer: 48 mL/min — ABNORMAL LOW (ref >60)

## 2019-09-08 SURGERY — UPPER EXTREMITY ANGIOGRAPHY
Anesthesia: Moderate Sedation | Laterality: Left

## 2019-09-08 MED ORDER — HEPARIN SODIUM (PORCINE) 1000 UNIT/ML IJ SOLN
INTRAMUSCULAR | Status: AC
  Filled 2019-09-08: qty 1

## 2019-09-08 MED ORDER — MIDAZOLAM HCL 2 MG/2ML IJ SOLN
INTRAMUSCULAR | Status: AC
  Filled 2019-09-08: qty 2

## 2019-09-08 MED ORDER — FENTANYL CITRATE (PF) 100 MCG/2ML IJ SOLN
INTRAMUSCULAR | Status: DC | PRN
  Administered 2019-09-08 (×8): 50 ug via INTRAVENOUS

## 2019-09-08 MED ORDER — ONDANSETRON HCL 4 MG/2ML IJ SOLN: 4.0000 mg | Freq: Four times a day (QID) | INTRAMUSCULAR | Status: DC | PRN

## 2019-09-08 MED ORDER — FENTANYL CITRATE (PF) 100 MCG/2ML IJ SOLN
INTRAMUSCULAR | Status: AC
  Filled 2019-09-08: qty 2

## 2019-09-08 MED ORDER — SODIUM CHLORIDE 0.9 % IV SOLN: INTRAVENOUS | Status: DC

## 2019-09-08 MED ORDER — MIDAZOLAM HCL 2 MG/2ML IJ SOLN
INTRAMUSCULAR | Status: AC
  Filled 2019-09-08: qty 4

## 2019-09-08 MED ORDER — FAMOTIDINE 20 MG PO TABS: 40.0000 mg | ORAL_TABLET | Freq: Once | ORAL | Status: DC | PRN

## 2019-09-08 MED ORDER — MIDAZOLAM HCL 2 MG/ML PO SYRP: 8.0000 mg | ORAL_SOLUTION | Freq: Once | ORAL | Status: DC | PRN

## 2019-09-08 MED ORDER — HEPARIN SODIUM (PORCINE) 1000 UNIT/ML IJ SOLN
INTRAMUSCULAR | Status: DC | PRN
  Administered 2019-09-08: 6000 [IU] via INTRAVENOUS

## 2019-09-08 MED ORDER — CLOPIDOGREL BISULFATE 75 MG PO TABS: 75.0000 mg | ORAL_TABLET | Freq: Every day | ORAL | 11 refills | Status: DC

## 2019-09-08 MED ORDER — MIDAZOLAM HCL 2 MG/2ML IJ SOLN
INTRAMUSCULAR | Status: DC | PRN
  Administered 2019-09-08: 1 mg via INTRAVENOUS
  Administered 2019-09-08: 2 mg via INTRAVENOUS
  Administered 2019-09-08: 1 mg via INTRAVENOUS
  Administered 2019-09-08: 2 mg via INTRAVENOUS
  Administered 2019-09-08 (×4): 1 mg via INTRAVENOUS

## 2019-09-08 MED ORDER — METHYLPREDNISOLONE SODIUM SUCC 125 MG IJ SOLR: 125.0000 mg | Freq: Once | INTRAMUSCULAR | Status: DC | PRN

## 2019-09-08 MED ORDER — DIPHENHYDRAMINE HCL 50 MG/ML IJ SOLN: 50.0000 mg | Freq: Once | INTRAMUSCULAR | Status: DC | PRN

## 2019-09-08 MED ORDER — ATORVASTATIN CALCIUM 10 MG PO TABS: 10.0000 mg | ORAL_TABLET | Freq: Every day | ORAL | 11 refills | Status: DC

## 2019-09-08 MED ORDER — IODIXANOL 320 MG/ML IV SOLN
INTRAVENOUS | Status: DC | PRN
  Administered 2019-09-08: 120 mL

## 2019-09-08 MED ORDER — HYDROMORPHONE HCL 1 MG/ML IJ SOLN: 1.0000 mg | Freq: Once | INTRAMUSCULAR | Status: DC | PRN

## 2019-09-08 SURGICAL SUPPLY — 25 items
BALLN LUTONIX DCB 4X100X130 (BALLOONS) ×2
BALLN LUTONIX DCB 5X40X130 (BALLOONS) ×2
BALLN MUSTANG 8X20X75 (BALLOONS) ×2
BALLOON LUTONIX DCB 4X100X130 (BALLOONS) IMPLANT
BALLOON LUTONIX DCB 5X40X130 (BALLOONS) IMPLANT
BALLOON MUSTANG 8X20X75 (BALLOONS) IMPLANT
CANISTER PENUMBRA ENGINE (MISCELLANEOUS) ×1 IMPLANT
CANNULA 5F STIFF (CANNULA) ×1 IMPLANT
CATH ANGIO 5F 100CM .035 PIG (CATHETERS) ×1 IMPLANT
CATH BEACON 5 .035 100 H1 TIP (CATHETERS) ×1 IMPLANT
CATH INDIGO CAT6 KIT (CATHETERS) ×1 IMPLANT
COVER PROBE U/S 5X48 (MISCELLANEOUS) ×1 IMPLANT
DEVICE PRESTO INFLATION (MISCELLANEOUS) ×1 IMPLANT
DEVICE STARCLOSE SE CLOSURE (Vascular Products) ×1 IMPLANT
DEVICE TORQUE .025-.038 (MISCELLANEOUS) ×1 IMPLANT
GLIDEWIRE ADV .035X260CM (WIRE) ×1 IMPLANT
GLIDEWIRE ANGLED SS 035X260CM (WIRE) ×1 IMPLANT
PACK ANGIOGRAPHY (CUSTOM PROCEDURE TRAY) ×1 IMPLANT
SHEATH BRITE TIP 5FRX11 (SHEATH) ×1 IMPLANT
SHEATH PINNACLE ST 6F 65CM (SHEATH) ×1 IMPLANT
STENT LIFESTREAM 7X26X80 (Permanent Stent) ×1 IMPLANT
SYR MEDRAD MARK 7 150ML (SYRINGE) ×1 IMPLANT
TUBING CONTRAST HIGH PRESS 72 (TUBING) ×1 IMPLANT
WIRE J 3MM .035X145CM (WIRE) ×1 IMPLANT
WIRE MAGIC TORQUE 260C (WIRE) ×1 IMPLANT

## 2019-09-08 NOTE — H&P (Signed)
[facility] History & Physical Update  The patient was interviewed and re-examined.  The patient's previous History and Physical has been reviewed and is unchanged.  There is no change in the plan of care. We plan to proceed with the scheduled procedure.  Leotis Pain, MD  09/08/2019, 8:08 AM

## 2019-09-08 NOTE — Op Note (Addendum)
OPERATIVE REPORT   PREOPERATIVE DIAGNOSIS: 1. 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 Ultrasound guidance vascular access to right femoral artery. 2. Catheter placement to left ulnar artery  from right femoral approach. 3. Thoracic aortogram and selective left upper extremity angiogram 4.  Percutaneous transluminal angioplasty of left proximal subclavian artery with 5 mm diameter angioplasty balloon 5.  Mechanical thrombectomy to the left subclavian artery and the left ulnar artery with the penumbra cat 6 device 6.  Percutaneous transluminal angioplasty of the left ulnar artery with 4 mm diameter Lutonix drug-coated angioplasty balloon 7.  Stent placement to the left subclavian artery with a 7 mm diameter by 28 mm length lifestream stent postdilated with an 8 mm balloon proximally 8. StarClose closure device right femoral artery.  SURGEON: Algernon Huxley, MD  ANESTHESIA: Local with moderate conscious sedation for 60 minutes using 10 mg of Versed and 400 mcg of Fentanyl  BLOOD LOSS: 150 cc  FLUOROSCOPY TIME: 18.2 minutes  INDICATION FOR PROCEDURE: This is a 58 y.o.female who presented to our office with symptomatic ischemia of the left arm with a left subclavian stenosis or occlusion suspected from the CT scan. To further evaluate this to determine what options would be possible to treat the ischemia, angiogram of the left upper extremity is indicated. Risks and benefits are discussed. Informed consent was obtained.  DESCRIPTION OF PROCEDURE: The patient was brought to the vascular suite. Moderate conscious sedation was administered during a face to face encounter with the patient throughout the procedure with my supervision of the RN administering medicines and monitoring the patient's vital signs, pulse  oximetry, telemetry and mental status throughout from the start of the procedure until the patient was taken to the recovery room.  Groins were shaved and prepped and sterile surgical field was created. The right femoral head was localized with fluoroscopy and the right femoral artery was then visualized with ultrasound and found to be widely patent. It was then accessed under direct ultrasound guidance without difficulty with a Seldinger needle and a permanent image was recorded. A J-wire and 5-French sheath were then placed. Pigtail catheter was placed into the ascending aorta and a thoracic aortogram was then performed in the LAO projection. This demonstrated normal origins to the innominate and left carotid arteries without significant proximal stenoses and a normal configuration of the great vessels. The left subclavian artery was occluded at or just beyond its origin with reconstitution through collaterals including the vertebral artery for the subclavian artery distal to this.  The patient was given 6000 units of intravenous heparin and a headhunter catheter was used to selectively cannulate the left subclavian artery with minimal difficulty. I was able to cross the occlusion with a Glidewire and the headhunter catheter and advanced into the left axillary artery beyond the occlusion.  This was then sequentially advanced to the brachial artery.  There was no stenosis within the brachial artery.  There was a reasonably high brachial bifurcation the radial artery was fairly normal and continuous into the hand on the palmar arch was not complete the flow to digits 4 and 5 was relatively poor.  There was what appeared to be a thrombotic or embolic near occlusion of the ulnar artery in the proximal segments that was markedly limiting flow distally.  This was not an unexpected finding.  The subclavian lesion likely thrombosed at  some point in the last couple of years and this embolus developed from  that.  The subclavian lesion was quite snug and we predilated with a 5 mm diameter by 4 cm length Lutonix drug-coated angioplasty balloon.  Following this, there remained high-grade residual stenosis with possible thrombus in the area although it was difficult to see.  With the thrombus at the antecubital fossa, the penumbra cat 6 device was brought onto the field and I started by performing mechanical thrombectomy of the left subclavian artery.  We then advanced this to the proximal ulnar artery and for mechanical thrombectomy in this location as well.  Following this, there remained a high-grade residual stenosis in the subclavian artery and a moderate to high-grade residual stenosis in the left ulnar artery in the 70 to 75% range but the thrombus appeared to be resolved.  I balloon the ulnar artery with a 4 mm diameter by 10 cm length Lutonix drug-coated angioplasty balloon inflated to 10 atm for 1 minute.  Completion imaging showed only about a 20% residual stenosis in the ulnar artery with markedly improved flow into the hand and maintain patency of the radial artery.  I then turned my attention back to the subclavian lesion.  A 7 mm diameter by 26 mm length lifestream stent was then deployed in the left subclavian artery with the origin at the origin of the artery flush with the edge of the aorta.  This was inflated to 12 atm.  There was some residual waist proximally so we upsized to an 8 mm diameter angioplasty balloon to post dilate the proximal portion and flared out just into the edge of the aorta.  This was inflated to 10 atm.  Completion imaging demonstrated only about a 10% residual stenosis in the left subclavian artery with markedly improved flow.  I elected to terminate the procedure.  The sheath was pulled down to the ipsilateral external iliac artery.  Oblique arteriogram was performed of the right femoral artery and StarClose closure device deployed in the usual fashion with excellent hemostatic  result. The patient tolerated the procedure well and was taken to the recovery room in stable condition.   Leotis Pain 09/08/2019 10:26 AM

## 2019-09-09 ENCOUNTER — Encounter: Payer: Self-pay | Admitting: Vascular Surgery

## 2019-09-15 ENCOUNTER — Other Ambulatory Visit (INDEPENDENT_AMBULATORY_CARE_PROVIDER_SITE_OTHER): Payer: Self-pay | Admitting: Nurse Practitioner

## 2019-09-15 ENCOUNTER — Telehealth (INDEPENDENT_AMBULATORY_CARE_PROVIDER_SITE_OTHER): Payer: Self-pay | Admitting: Vascular Surgery

## 2019-09-15 MED ORDER — SIMVASTATIN 10 MG PO TABS: 10.0000 mg | ORAL_TABLET | Freq: Every evening | ORAL | 11 refills | Status: DC

## 2019-09-15 NOTE — Telephone Encounter (Signed)
I sent in for the patient to take simvastatin 10mg  to her [facility] in [place].  If this also causes her to break out, we can defer to her PCP to run further additional tests to assess her cholesterol levels and see what may be more beneficial

## 2019-09-15 NOTE — Telephone Encounter (Signed)
Patient has been made aware with medical advice and verbalized understanding 

## 2019-10-04 ENCOUNTER — Other Ambulatory Visit (INDEPENDENT_AMBULATORY_CARE_PROVIDER_SITE_OTHER): Payer: Self-pay | Admitting: Vascular Surgery

## 2019-10-04 DIAGNOSIS — I6502 Occlusion and stenosis of left vertebral artery: Secondary | ICD-10-CM

## 2019-10-04 DIAGNOSIS — I708 Atherosclerosis of other arteries: Secondary | ICD-10-CM

## 2019-10-04 DIAGNOSIS — Z95820 Peripheral vascular angioplasty status with implants and grafts: Secondary | ICD-10-CM

## 2019-10-06 ENCOUNTER — Ambulatory Visit (INDEPENDENT_AMBULATORY_CARE_PROVIDER_SITE_OTHER): Payer: 59 | Admitting: Nurse Practitioner

## 2019-10-06 ENCOUNTER — Other Ambulatory Visit: Payer: Self-pay

## 2019-10-06 ENCOUNTER — Ambulatory Visit (INDEPENDENT_AMBULATORY_CARE_PROVIDER_SITE_OTHER): Payer: 59

## 2019-10-06 DIAGNOSIS — I6502 Occlusion and stenosis of left vertebral artery: Secondary | ICD-10-CM

## 2019-10-06 DIAGNOSIS — I708 Atherosclerosis of other arteries: Secondary | ICD-10-CM

## 2019-10-06 DIAGNOSIS — Z95820 Peripheral vascular angioplasty status with implants and grafts: Secondary | ICD-10-CM

## 2019-10-14 ENCOUNTER — Encounter (INDEPENDENT_AMBULATORY_CARE_PROVIDER_SITE_OTHER): Payer: Self-pay | Admitting: Vascular Surgery

## 2020-04-04 ENCOUNTER — Other Ambulatory Visit (INDEPENDENT_AMBULATORY_CARE_PROVIDER_SITE_OTHER): Payer: Self-pay | Admitting: Vascular Surgery

## 2020-04-04 DIAGNOSIS — M79602 Pain in left arm: Secondary | ICD-10-CM

## 2020-04-04 DIAGNOSIS — Z95820 Peripheral vascular angioplasty status with implants and grafts: Secondary | ICD-10-CM

## 2020-04-06 ENCOUNTER — Ambulatory Visit (INDEPENDENT_AMBULATORY_CARE_PROVIDER_SITE_OTHER): Payer: 59 | Admitting: Vascular Surgery

## 2020-04-06 ENCOUNTER — Ambulatory Visit (INDEPENDENT_AMBULATORY_CARE_PROVIDER_SITE_OTHER): Payer: 59

## 2020-04-06 ENCOUNTER — Other Ambulatory Visit: Payer: Self-pay

## 2020-04-06 VITALS — BP 122/81 | HR 87 | Ht 65.0 in | Wt 277.0 lb

## 2020-04-06 DIAGNOSIS — M79602 Pain in left arm: Secondary | ICD-10-CM | POA: Diagnosis not present

## 2020-04-06 DIAGNOSIS — N183 Chronic kidney disease, stage 3 unspecified: Secondary | ICD-10-CM | POA: Diagnosis not present

## 2020-04-06 DIAGNOSIS — E1122 Type 2 diabetes mellitus with diabetic chronic kidney disease: Secondary | ICD-10-CM | POA: Diagnosis not present

## 2020-04-06 DIAGNOSIS — I771 Stricture of artery: Secondary | ICD-10-CM

## 2020-04-06 DIAGNOSIS — I10 Essential (primary) hypertension: Secondary | ICD-10-CM | POA: Diagnosis not present

## 2020-04-06 DIAGNOSIS — Z95820 Peripheral vascular angioplasty status with implants and grafts: Secondary | ICD-10-CM | POA: Diagnosis not present

## 2020-04-06 MED ORDER — ROSUVASTATIN CALCIUM 5 MG PO TABS: 5.0000 mg | ORAL_TABLET | Freq: Every day | ORAL | 3 refills | Status: DC

## 2020-04-06 NOTE — Progress Notes (Signed)
MRN : [id number]  Alyssa Maynard is a 59 y.o. (04/16/1961) female who presents with chief complaint of No chief complaint on file. [name]  History of Present Illness: Patient returns today in follow up of her left subclavian artery occlusion and left upper extremity ischemia.  She is about 7 months status post endovascular treatment for this with revascularization.  Her arm is feeling much better.  She does still have some occasional pain in that left arm but nothing severe.  Duplex today appears that her stent is patent and there may be a focal area of occlusion in the ulnar artery distally but otherwise the flow seems reasonable.  She is having a lot of leg cramps and wonders if it might be a result of her Lipitor.  Current Outpatient Medications  Medication Sig Dispense Refill  . aspirin 81 MG chewable tablet Chew 1 tablet (81 mg total) by mouth 2 (two) times daily. 60 tablet 0  . cholecalciferol (VITAMIN D) 1000 units tablet Take 2,000 Units by mouth daily.    [name] esomeprazole (NEXIUM) 40 MG capsule Take 40 mg by mouth daily.    [name] glimepiride (AMARYL) 4 MG tablet Take 4 mg by mouth daily.   0  . lisinopril-hydrochlorothiazide (PRINZIDE,ZESTORETIC) 20-12.5 MG tablet Take 1 tablet by mouth daily.     . pioglitazone (ACTOS) 30 MG tablet Take 1 tablet by mouth daily.  1  . albuterol (PROAIR HFA) 108 (90 Base) MCG/ACT inhaler once daily as needed. Reported on 10/26/2015    . B-D ULTRA-FINE 33 LANCETS MISC Use 1 Device once daily.    . cephALEXin (KEFLEX) 500 MG capsule Take 1 capsule (500 mg total) by mouth 4 (four) times daily. (Patient not taking: Reported on 08/30/2019) 40 capsule 0  . clopidogrel (PLAVIX) 75 MG tablet Take 1 tablet (75 mg total) by mouth daily. (Patient not taking: Reported on 04/06/2020) 30 tablet 11  . docusate sodium (COLACE) 100 MG capsule Take 1 capsule (100 mg total) by mouth 2 (two) times daily. (Patient not taking: Reported on 08/30/2019) 10 capsule 0  . escitalopram  (LEXAPRO) 20 MG tablet Take 20 mg by mouth daily.    [name] glucose blood (ONE TOUCH ULTRA TEST) test strip TEST once daily    . oxyCODONE (OXYCONTIN) 10 mg 12 hr tablet Take 1 tablet (10 mg total) by mouth every 12 (twelve) hours. (Patient not taking: Reported on 08/30/2019) 10 tablet 0  . oxyCODONE-acetaminophen (PERCOCET) 10-325 MG tablet Take 1 tablet by mouth every 6 (six) hours as needed for pain. (Patient not taking: Reported on 08/30/2019) 20 tablet 0  . rosuvastatin (CRESTOR) 5 MG tablet Take 1 tablet (5 mg total) by mouth daily. 30 tablet 3  . sitaGLIPtin (JANUVIA) 50 MG tablet Take 50 mg by mouth daily.     [name] sulfamethoxazole-trimethoprim (BACTRIM DS) 800-160 MG tablet Take 1 tablet by mouth 2 (two) times daily. (Patient not taking: Reported on 08/30/2019) 20 tablet 0  . tiZANidine (ZANAFLEX) 2 MG tablet 1-2 tablets at bedtime as needed for muscle spasm    . traMADol (ULTRAM) 50 MG tablet Take 50 mg by mouth.      No current facility-administered medications for this visit.    Past Medical History:  Diagnosis Date  . Anxiety   . Chronic kidney disease 04/2017   stage 3  . Complication of anesthesia    pt experiences difficulty breathing after anesthesia due to sleep apnea and smoking  . Depression   .  Diabetes mellitus without complication ([place])   . Dyspnea   . GERD (gastroesophageal reflux disease)   . Hypertension   . Renal insufficiency   . Sleep apnea     Past Surgical History:  Procedure Laterality Date  . APPENDECTOMY    . CAROTID ANGIOGRAPHY N/A 04/27/2017   Procedure: Carotid Angiography;  Surgeon: Algernon Huxley, MD;  Location: [place] [facility];  Service: Cardiovascular;  Laterality: N/A;  . CHOLECYSTECTOMY    . FOOT SURGERY Left   . HAND SURGERY Right   . HYSTEROSCOPY WITH D & C N/A 11/02/2015   Procedure: DILATATION AND CURETTAGE /HYSTEROSCOPY;  Surgeon: Lorette Ang, MD;  Location: [facility];  Service: Gynecology;  Laterality: N/A;  . HYSTEROSCOPY WITH D &  C    . LIVER SURGERY    . ORIF TIBIA PLATEAU Right 07/05/2018   Procedure: OPEN REDUCTION INTERNAL FIXATION (ORIF) TIBIAL PLATEAU;  Surgeon: Lovell Sheehan, MD;  Location: [facility];  Service: Orthopedics;  Laterality: Right;  . TONSILLECTOMY    . UPPER EXTREMITY ANGIOGRAPHY Left 09/08/2019   Procedure: UPPER EXTREMITY ANGIOGRAPHY;  Surgeon: Algernon Huxley, MD;  Location: [place] [facility];  Service: Cardiovascular;  Laterality: Left;  . UPPER GI ENDOSCOPY       Social History   Tobacco Use  . Smoking status: Current Every Day Smoker    Packs/day: 1.00  . Smokeless tobacco: Never Used  Substance Use Topics  . Alcohol use: Yes    Alcohol/week: 14.0 standard drinks    Types: 14 Shots of liquor per week  . Drug use: No      Family History  Problem Relation Age of Onset  . Heart attack Mother   . Stroke Mother   . Heart attack Father   . Cancer Father      Allergies  Allergen Reactions  . Latex Rash and Other (See Comments)    Burns skin   . Clopidogrel Rash    REVIEW OF SYSTEMS(Negative unless checked)  Constitutional: [] ??Weight loss[] ??Fever[] ??Chills Cardiac:[] ??Chest pain[] ??Chest pressure[] ??Palpitations [] ??Shortness of breath when laying flat [] ??Shortness of breath at rest [] ??Shortness of breath with exertion. Vascular: [x] ??Pain in legs with walking[x] ??Pain in legsat rest[] ??Pain in legs when laying flat [] ??Claudication [] ??Pain in feet when walking [] ??Pain in feet at rest [] ??Pain in feet when laying flat [] ??History of DVT [] ??Phlebitis [] ??Swelling in legs [] ??Varicose veins [] ??Non-healing ulcers Pulmonary: [] ??Uses home oxygen [] ??Productive cough[] ??Hemoptysis [] ??Wheeze [] ??COPD [] ??Asthma Neurologic: [x] ??Dizziness [] ??Blackouts [] ??Seizures [] ??History of stroke [] ??History of TIA[] ??Aphasia [] ??Temporary blindness[] ??Dysphagia [x] ??Weaknessor numbness in arms [] ??Weakness or  numbnessin legs Musculoskeletal: [x] ??Arthritis [] ??Joint swelling [] ??Joint pain [] ??Low back pain Hematologic:[] ??Easy bruising[] ??Easy bleeding [] ??Hypercoagulable state [] ??Anemic [] ??Hepatitis Gastrointestinal:[] ??Blood in stool[] ??Vomiting blood[x] ??Gastroesophageal reflux/heartburn[] ??Abdominal pain Genitourinary: [] ??Chronic kidney disease [] ??Difficulturination [] ??Frequenturination [] ??Burning with urination[] ??Hematuria Skin: [] ??Rashes [x] ??Ulcers [x] ??Wounds Psychological: [x] ??History of anxiety[x] ??History of major depression.  Physical Examination  BP 122/81   Pulse 87   Ht 5\' 5"  (1.651 m)   Wt 277 lb (125.6 kg)   LMP 08/27/2015 (LMP Unknown) Comment: abnormal bleeding  BMI 46.10 kg/m  Gen:  WD/WN, NAD Head: [place]/AT, No temporalis wasting. Ear/Nose/Throat: Hearing grossly intact, nares w/o erythema or drainage Eyes: Conjunctiva clear. Sclera non-icteric Neck: Supple.  Trachea midline Pulmonary:  Good air movement, no use of accessory muscles.  Cardiac: RRR, no JVD Vascular:  Vessel Right Left  Radial Palpable Palpable           Musculoskeletal: M/S 5/5 throughout.  No deformity or atrophy.  Trace lower extremity edema. Neurologic: Sensation grossly intact  in extremities.  Symmetrical.  Speech is fluent.  Psychiatric: Judgment intact, Mood & affect appropriate for pt's clinical situation. Dermatologic: No rashes or ulcers noted.  No cellulitis or open wounds.       Labs No results found for this or any previous visit (from the past 2160 hour(s)).  Radiology No results found.  Assessment/Plan Diabetes (HCC) blood glucose control important in reducing the progression of atherosclerotic disease. Also, involved in wound healing. On appropriate medications.   Essential hypertension, benign blood pressure control important in reducing the progression of atherosclerotic disease. On appropriate oral  medications.  CKD (chronic kidney disease) stage 3, GFR 30-59 ml/min We will hydrate and try to limit contrast with her any procedures requiring contrast.  Subclavian arterial stenosis (HCC) Duplex today appears that her stent is patent and there may be a focal area of occlusion in the ulnar artery distally but otherwise the flow seems reasonable.  Arm is doing much better.  I am going to switch her from Zocor to Crestor to see if that helps her leg cramps.  Recheck with duplex in 6 months.    Festus Barren, MD  04/06/2020 11:27 AM    This note was created with Dragon medical transcription system.  Any errors from dictation are purely unintentional

## 2020-04-06 NOTE — Assessment & Plan Note (Signed)
Duplex today appears that her stent is patent and there may be a focal area of occlusion in the ulnar artery distally but otherwise the flow seems reasonable.  Arm is doing much better.  I am going to switch her from Zocor to Crestor to see if that helps her leg cramps.  Recheck with duplex in 6 months.

## 2020-06-23 ENCOUNTER — Ambulatory Visit
Admission: EM | Admit: 2020-06-23 | Discharge: 2020-06-23 | Disposition: A | Discharge status: Home / Self Care | Payer: 59 | Attending: Family Medicine | Admitting: Family Medicine

## 2020-06-23 ENCOUNTER — Other Ambulatory Visit: Payer: Self-pay

## 2020-06-23 ENCOUNTER — Encounter: Payer: Self-pay | Admitting: Emergency Medicine

## 2020-06-23 DIAGNOSIS — B349 Viral infection, unspecified: Secondary | ICD-10-CM

## 2020-06-23 DIAGNOSIS — G4733 Obstructive sleep apnea (adult) (pediatric): Secondary | ICD-10-CM | POA: Diagnosis not present

## 2020-06-23 DIAGNOSIS — F419 Anxiety disorder, unspecified: Secondary | ICD-10-CM | POA: Diagnosis not present

## 2020-06-23 DIAGNOSIS — Z7984 Long term (current) use of oral hypoglycemic drugs: Secondary | ICD-10-CM | POA: Insufficient documentation

## 2020-06-23 DIAGNOSIS — N183 Chronic kidney disease, stage 3 unspecified: Secondary | ICD-10-CM | POA: Insufficient documentation

## 2020-06-23 DIAGNOSIS — Z7982 Long term (current) use of aspirin: Secondary | ICD-10-CM | POA: Insufficient documentation

## 2020-06-23 DIAGNOSIS — Z79899 Other long term (current) drug therapy: Secondary | ICD-10-CM | POA: Insufficient documentation

## 2020-06-23 DIAGNOSIS — E1122 Type 2 diabetes mellitus with diabetic chronic kidney disease: Secondary | ICD-10-CM | POA: Insufficient documentation

## 2020-06-23 DIAGNOSIS — F329 Major depressive disorder, single episode, unspecified: Secondary | ICD-10-CM | POA: Insufficient documentation

## 2020-06-23 DIAGNOSIS — R112 Nausea with vomiting, unspecified: Secondary | ICD-10-CM

## 2020-06-23 DIAGNOSIS — K219 Gastro-esophageal reflux disease without esophagitis: Secondary | ICD-10-CM | POA: Insufficient documentation

## 2020-06-23 DIAGNOSIS — I2510 Atherosclerotic heart disease of native coronary artery without angina pectoris: Secondary | ICD-10-CM | POA: Diagnosis not present

## 2020-06-23 DIAGNOSIS — M5116 Intervertebral disc disorders with radiculopathy, lumbar region: Secondary | ICD-10-CM | POA: Insufficient documentation

## 2020-06-23 DIAGNOSIS — R05 Cough: Secondary | ICD-10-CM | POA: Diagnosis not present

## 2020-06-23 DIAGNOSIS — Z7902 Long term (current) use of antithrombotics/antiplatelets: Secondary | ICD-10-CM | POA: Insufficient documentation

## 2020-06-23 DIAGNOSIS — U071 COVID-19: Secondary | ICD-10-CM | POA: Insufficient documentation

## 2020-06-23 DIAGNOSIS — I129 Hypertensive chronic kidney disease with stage 1 through stage 4 chronic kidney disease, or unspecified chronic kidney disease: Secondary | ICD-10-CM | POA: Insufficient documentation

## 2020-06-23 DIAGNOSIS — F17210 Nicotine dependence, cigarettes, uncomplicated: Secondary | ICD-10-CM | POA: Insufficient documentation

## 2020-06-23 DIAGNOSIS — Z8249 Family history of ischemic heart disease and other diseases of the circulatory system: Secondary | ICD-10-CM | POA: Diagnosis not present

## 2020-06-23 DIAGNOSIS — R509 Fever, unspecified: Secondary | ICD-10-CM

## 2020-06-23 DIAGNOSIS — R5383 Other fatigue: Secondary | ICD-10-CM

## 2020-06-23 DIAGNOSIS — Z9049 Acquired absence of other specified parts of digestive tract: Secondary | ICD-10-CM | POA: Insufficient documentation

## 2020-06-23 DIAGNOSIS — R059 Cough, unspecified: Secondary | ICD-10-CM

## 2020-06-23 LAB — INFLUENZA PANEL BY PCR (TYPE A & B)
Influenza A By PCR: NEGATIVE
Influenza B By PCR: NEGATIVE

## 2020-06-23 MED ORDER — ONDANSETRON 4 MG PO TBDP: 4.0000 mg | ORAL_TABLET | Freq: Three times a day (TID) | ORAL | 0 refills | Status: AC | PRN

## 2020-06-23 NOTE — Discharge Instructions (Addendum)
YOU CAN TAKE REGULAR MUCINEX FOR CONGESTION AND DRINK PLENTY OF FLUIDS IF POSITIVE COVID, ISOLATE 8 MORE DAYS FOR ANY NEW/WORSENING SYMPTOMS- BREATHING DIFFICULTY, CHEST PAIN, INCREASED ABDOMINAL PAIN, WEAKNESS, SEEK RE-EXAMINATION HERE OR ED  You have received COVID testing today either for positive exposure, concerning symptoms that could be related to COVID infection, screening purposes, or re-testing after confirmed positive.  Your test obtained today checks for active viral infection in the last 1-2 weeks. If your test is negative now, you can still test positive later. So, if you do develop symptoms you should either get re-tested and/or isolate x 10 days. Please follow CDC guidelines.  While Rapid antigen tests come back in 15-20 minutes, send out PCR/molecular test results typically come back within 24 hours. In the mean time, if you are symptomatic, assume this could be a positive test and treat/monitor yourself as if you do have COVID.   We will call with test results. Please download the MyChart app and set up a profile to access test results.   If symptomatic, go home and rest. Push fluids. Take Tylenol as needed for discomfort. Gargle warm salt water. Throat lozenges. Take Mucinex DM or Robitussin for cough. Humidifier in bedroom to ease coughing. Warm showers. Also review the COVID handout for more information.  COVID-19 INFECTION: The incubation period of COVID-19 is approximately 14 days after exposure, with most symptoms developing in roughly 4-5 days. Symptoms may range in severity from mild to critically severe. Roughly 80% of those infected will have mild symptoms. People of any age may become infected with COVID-19 and have the ability to transmit the virus. The most common symptoms include: fever, fatigue, cough, body aches, headaches, sore throat, nasal congestion, shortness of breath, nausea, vomiting, diarrhea, changes in smell and/or taste.    COURSE OF ILLNESS Some patients  may begin with mild disease which can progress quickly into critical symptoms. If your symptoms are worsening please call ahead to the Emergency Department and proceed there for further treatment. Recovery time appears to be roughly 1-2 weeks for mild symptoms and 3-6 weeks for severe disease.   GO IMMEDIATELY TO ER FOR FEVER YOU ARE UNABLE TO GET DOWN WITH TYLENOL, BREATHING PROBLEMS, CHEST PAIN, FATIGUE, LETHARGY, INABILITY TO EAT OR DRINK, ETC  QUARANTINE AND ISOLATION: To help decrease the spread of COVID-19 please remain isolated if you have COVID infection or are highly suspected to have COVID infection. This means -stay home and isolate to one room in the home if you live with others. Do not share a bed or bathroom with others while ill, sanitize and wipe down all countertops and keep common areas clean and disinfected. You may discontinue isolation if you have a mild case and are asymptomatic 10 days after symptom onset as long as you have been fever free >24 hours without having to take Motrin or Tylenol. If your case is more severe (meaning you develop pneumonia or are admitted in the hospital), you may have to isolate longer.   If you have been in close contact (within 6 feet) of someone diagnosed with COVID 19, you are advised to quarantine in your home for 14 days as symptoms can develop anywhere from 2-14 days after exposure to the virus. If you develop symptoms, you  must isolate.  Most current guidelines for COVID after exposure -isolate 10 days if you ARE NOT tested for COVID as long as symptoms do not develop -isolate 7 days if you are tested and remain asymptomatic -  You do not necessarily need to be tested for COVID if you have + exposure and        develop   symptoms. Just isolate at home x10 days from symptom onset During this global pandemic, CDC advises to practice social distancing, try to stay at least 67ft away from others at all times. Wear a face covering. Wash and sanitize your  hands regularly and avoid going anywhere that is not necessary.  KEEP IN MIND THAT THE COVID TEST IS NOT 100% ACCURATE AND YOU SHOULD STILL DO EVERYTHING TO PREVENT POTENTIAL SPREAD OF VIRUS TO OTHERS (WEAR MASK, WEAR GLOVES, WASH HANDS AND SANITIZE REGULARLY). IF INITIAL TEST IS NEGATIVE, THIS MAY NOT MEAN YOU ARE DEFINITELY NEGATIVE. MOST ACCURATE TESTING IS DONE 5-7 DAYS AFTER EXPOSURE.   It is not advised by CDC to get re-tested after receiving a positive COVID test since you can still test positive for weeks to months after you have already cleared the virus.   *If you have not been vaccinated for COVID, I strongly suggest you consider getting vaccinated as long as there are no contraindications.

## 2020-06-23 NOTE — ED Provider Notes (Signed)
[facility]    CSN: [id number] Arrival date & time: 06/23/20  1335      History   Chief Complaint Chief Complaint  Patient presents with   Headache   Cough   Fever    HPI Alyssa Maynard is a 59 y.o. female. 59 y/o female presents for fever, cough, congestion,  fatigue x 2 days. Patient also admits to nausea, reduced appetite and 1 episode of vomiting. Patient denies known COVID exposure. Denies vaccination. She denies weakness, chest pain,  SOB, abdominal pain, diarrhea. Has been taking OTC medications with some relief of symptoms. Has also been taking antipyretics with last dose >8 hours ago.   Patient's medical history significant for T2DM, stage 3 CKD, sleep apnea.  No other concerns today.  HPI  Past Medical History:  Diagnosis Date   Anxiety    Chronic kidney disease 04/2017   stage 3   Complication of anesthesia    pt experiences difficulty breathing after anesthesia due to sleep apnea and smoking   Depression    Diabetes mellitus without complication (HCC)    Dyspnea    GERD (gastroesophageal reflux disease)    Hypertension    Renal insufficiency    Sleep apnea     Patient Active Problem List   Diagnosis Date Noted   Tobacco use disorder 08/30/2019   Coronary artery disease involving native coronary artery of native heart without angina pectoris 08/16/2019   Subclavian arterial stenosis (HCC) 08/16/2019   Secondary hyperparathyroidism of renal origin (HCC) 07/29/2019   BMI 45.0-49.9, adult (HCC) 03/15/2019   Myalgia 03/15/2019   Closed fracture of right proximal tibia 07/05/2018   Tibial plateau fracture 07/04/2018   Degenerative disc disease, lumbar 09/08/2017   Acute back pain with sciatica, left 07/13/2017   Lumbar radiculopathy 05/31/2017   Type 2 diabetes mellitus with stage 3 chronic kidney disease, without long-term current use of insulin (HCC) 04/07/2017   Essential hypertension 04/07/2017   Dizziness and  giddiness 04/07/2017   Prolonged Q-T interval on ECG 02/17/2017   CKD (chronic kidney disease) stage 3, GFR 30-59 ml/min 08/12/2016   Mixed anxiety and depressive disorder 04/26/2015   Gastroesophageal reflux disease without esophagitis 04/26/2015   Tobacco dependence 06/07/2014   OSA (obstructive sleep apnea) 06/07/2014   HTN (hypertension) 06/07/2014   Depression 06/07/2014    Past Surgical History:  Procedure Laterality Date   APPENDECTOMY     CARDIAC SURGERY     CAROTID ANGIOGRAPHY N/A 04/27/2017   Procedure: Carotid Angiography;  Surgeon: Annice Needy, MD;  Location: [facility];  Service: Cardiovascular;  Laterality: N/A;   CHOLECYSTECTOMY     FOOT SURGERY Left    HAND SURGERY Right    HYSTEROSCOPY WITH D & C N/A 11/02/2015   Procedure: DILATATION AND CURETTAGE /HYSTEROSCOPY;  Surgeon: Ala Dach, MD;  Location: [facility];  Service: Gynecology;  Laterality: N/A;   HYSTEROSCOPY WITH D & C     LIVER SURGERY     ORIF TIBIA PLATEAU Right 07/05/2018   Procedure: OPEN REDUCTION INTERNAL FIXATION (ORIF) TIBIAL PLATEAU;  Surgeon: Lyndle Herrlich, MD;  Location: [facility];  Service: Orthopedics;  Laterality: Right;   TONSILLECTOMY     UPPER EXTREMITY ANGIOGRAPHY Left 09/08/2019   Procedure: UPPER EXTREMITY ANGIOGRAPHY;  Surgeon: Annice Needy, MD;  Location: [facility];  Service: Cardiovascular;  Laterality: Left;   UPPER GI ENDOSCOPY      OB History   No obstetric history on  file.      Home Medications    Prior to Admission medications   Medication Sig Start Date End Date Taking? Authorizing Provider  aspirin 81 MG chewable tablet Chew 1 tablet (81 mg total) by mouth 2 (two) times daily. 07/08/18  Yes Lyndle Herrlich, MD  cholecalciferol (VITAMIN D) 1000 units tablet Take 2,000 Units by mouth daily.   Yes [provider]  escitalopram (LEXAPRO) 20 MG tablet Take 20 mg by mouth daily.   Yes [provider]  esomeprazole  (NEXIUM) 40 MG capsule Take 40 mg by mouth daily.   Yes [provider]  glimepiride (AMARYL) 4 MG tablet Take 4 mg by mouth daily.  03/20/17  Yes [provider]  lisinopril-hydrochlorothiazide (PRINZIDE,ZESTORETIC) 20-12.5 MG tablet Take 1 tablet by mouth daily.    Yes [provider]  pioglitazone (ACTOS) 30 MG tablet Take 1 tablet by mouth daily. 06/07/18  Yes [provider]  rosuvastatin (CRESTOR) 5 MG tablet Take 1 tablet (5 mg total) by mouth daily. 04/06/20  Yes Annice Needy, MD  albuterol (PROAIR HFA) 108 (90 Base) MCG/ACT inhaler once daily as needed. Reported on 10/26/2015 05/23/14   [provider]  B-D ULTRA-FINE 33 LANCETS MISC Use 1 Device once daily. 07/30/15   [provider]  cephALEXin (KEFLEX) 500 MG capsule Take 1 capsule (500 mg total) by mouth 4 (four) times daily. Patient not taking: Reported on 08/30/2019 05/12/19   Tommi Rumps, PA-C  clopidogrel (PLAVIX) 75 MG tablet Take 1 tablet (75 mg total) by mouth daily. Patient not taking: Reported on 04/06/2020 09/08/19   Annice Needy, MD  docusate sodium (COLACE) 100 MG capsule Take 1 capsule (100 mg total) by mouth 2 (two) times daily. Patient not taking: Reported on 08/30/2019 07/08/18   Lyndle Herrlich, MD  glucose blood (ONE TOUCH ULTRA TEST) test strip TEST once daily 03/17/17   [provider]  ondansetron (ZOFRAN ODT) 4 MG disintegrating tablet Take 1 tablet (4 mg total) by mouth every 8 (eight) hours as needed for up to 5 days for nausea or vomiting. 06/23/20 06/28/20  Eusebio Friendly B, PA-C  oxyCODONE (OXYCONTIN) 10 mg 12 hr tablet Take 1 tablet (10 mg total) by mouth every 12 (twelve) hours. Patient not taking: Reported on 08/30/2019 07/08/18   Lyndle Herrlich, MD  OZEMPIC, 0.25 OR 0.5 MG/DOSE, 2 MG/1.5ML SOPN SMARTSIG:0.25 Milligram(s) SUB-Q Once a Week 05/28/20   [provider]  sitaGLIPtin (JANUVIA) 50 MG tablet Take 50 mg by mouth daily.  02/21/17 07/04/18   [provider]  sulfamethoxazole-trimethoprim (BACTRIM DS) 800-160 MG tablet Take 1 tablet by mouth 2 (two) times daily. Patient not taking: Reported on 08/30/2019 05/12/19   Tommi Rumps, PA-C  tiZANidine (ZANAFLEX) 2 MG tablet 1-2 tablets at bedtime as needed for muscle spasm 05/25/17   [provider]  traMADol (ULTRAM) 50 MG tablet Take 50 mg by mouth.  05/25/17   [provider]  TRESIBA FLEXTOUCH 100 UNIT/ML FlexTouch Pen SMARTSIG:25 Unit(s) SUB-Q Every Night 06/15/20   [provider]    Family History Family History  Problem Relation Age of Onset   Heart attack Mother    Stroke Mother    Heart attack Father    Cancer Father     Social History Social History   Tobacco Use   Smoking status: Current Every Day Smoker    Packs/day: 1.00   Smokeless tobacco: Never Used  Vaping Use  Vaping Use: Never used  Substance Use Topics   Alcohol use: Yes    Alcohol/week: 14.0 standard drinks    Types: 14 Shots of liquor per week   Drug use: No     Allergies   Latex and Clopidogrel   Review of Systems Review of Systems  Constitutional: Positive for fatigue and fever. Negative for chills and diaphoresis.  HENT: Positive for congestion. Negative for ear pain, rhinorrhea, sinus pressure, sinus pain and sore throat.   Respiratory: Positive for cough. Negative for shortness of breath.   Gastrointestinal: Positive for nausea and vomiting (1 episode). Negative for abdominal pain.  Musculoskeletal: Positive for myalgias. Negative for arthralgias.  Skin: Negative for rash.  Neurological: Positive for headaches. Negative for weakness.  Hematological: Negative for adenopathy.     Physical Exam Triage Vital Signs ED Triage Vitals  Enc Vitals Group     BP 06/23/20 1414 (!) 113/54     Pulse Rate 06/23/20 1414 97     Resp 06/23/20 1414 16     Temp 06/23/20 1414 (!) 101.6 F (38.7 C)     Temp Source 06/23/20 1414 Oral     SpO2  06/23/20 1414 96 %     Weight 06/23/20 1408 259 lb (117.5 kg)     Height 06/23/20 1408 5\' 6"  (1.676 m)     Head Circumference --      Peak Flow --      Pain Score 06/23/20 1408 8     Pain Loc --      Pain Edu? --      Excl. in GC? --    No data found.  Updated Vital Signs BP (!) 113/54 (BP Location: Left Arm)    Pulse 97    Temp (!) 101.6 F (38.7 C) (Oral) Comment: Patient states that she has Tylenol with her and will take some now   Resp 16    Ht 5\' 6"  (1.676 m)    Wt 259 lb (117.5 kg)    LMP 08/27/2015 (LMP Unknown) Comment: abnormal bleeding   SpO2 96%    BMI 41.80 kg/m       Physical Exam Vitals and nursing note reviewed.  Constitutional:      General: She is not in acute distress.    Appearance: Normal appearance. She is not ill-appearing or toxic-appearing.  HENT:     Head: Normocephalic and atraumatic.     Nose: Nose normal.     Mouth/Throat:     Mouth: Mucous membranes are moist.     Pharynx: Oropharynx is clear. Posterior oropharyngeal erythema present.  Eyes:     General: No scleral icterus.       Right eye: No discharge.        Left eye: No discharge.     Conjunctiva/sclera: Conjunctivae normal.  Cardiovascular:     Rate and Rhythm: Normal rate and regular rhythm.     Heart sounds: Normal heart sounds.  Pulmonary:     Effort: Pulmonary effort is normal. No respiratory distress.     Breath sounds: Normal breath sounds.  Abdominal:     Palpations: Abdomen is soft.     Tenderness: There is no abdominal tenderness.  Musculoskeletal:     Cervical back: Neck supple.  Skin:    General: Skin is dry.  Neurological:     General: No focal deficit present.     Mental Status: She is alert. Mental status is at baseline.     Motor: No weakness.  Gait: Gait normal.  Psychiatric:        Mood and Affect: Mood normal.        Behavior: Behavior normal.        Thought Content: Thought content normal.      UC Treatments / Results  Labs (all labs ordered are  listed, but only abnormal results are displayed) Labs Reviewed  SARS CORONAVIRUS 2 (TAT 6-24 HRS)  INFLUENZA PANEL BY PCR (TYPE A & B)    EKG   Radiology No results found.  Procedures Procedures (including critical care time)  Medications Ordered in UC Medications - No data to display  Initial Impression / Assessment and Plan / UC Course  I have reviewed the triage vital signs and the nursing notes.  Pertinent labs & imaging results that were available during my care of the patient were reviewed by me and considered in my medical decision making (see chart for details).   59 y/o female with suspected COVID based on symptoms. Negative flu test today. Patient does have elevated temp at 101.6 degrees but otherwise normal vitals. Chest is CTA and she is non toxic appearing. Patient suitable for at home care at this time. Prescription given for zofran and advised Mucinex, rest and fluids. Discussed CDC isolation protocol and ED guidelines reviewed.    Final Clinical Impressions(s) / UC Diagnoses   Final diagnoses:  Viral illness  Fever, unspecified  Fatigue, unspecified type  Cough  Non-intractable vomiting with nausea, unspecified vomiting type     Discharge Instructions     YOU CAN TAKE REGULAR MUCINEX FOR CONGESTION AND DRINK PLENTY OF FLUIDS IF POSITIVE COVID, ISOLATE 8 MORE DAYS FOR ANY NEW/WORSENING SYMPTOMS- BREATHING DIFFICULTY, CHEST PAIN, INCREASED ABDOMINAL PAIN, WEAKNESS, SEEK RE-EXAMINATION HERE OR ED  You have received COVID testing today either for positive exposure, concerning symptoms that could be related to COVID infection, screening purposes, or re-testing after confirmed positive.  Your test obtained today checks for active viral infection in the last 1-2 weeks. If your test is negative now, you can still test positive later. So, if you do develop symptoms you should either get re-tested and/or isolate x 10 days. Please follow CDC guidelines.  While Rapid  antigen tests come back in 15-20 minutes, send out PCR/molecular test results typically come back within 24 hours. In the mean time, if you are symptomatic, assume this could be a positive test and treat/monitor yourself as if you do have COVID.   We will call with test results. Please download the MyChart app and set up a profile to access test results.   If symptomatic, go home and rest. Push fluids. Take Tylenol as needed for discomfort. Gargle warm salt water. Throat lozenges. Take Mucinex DM or Robitussin for cough. Humidifier in bedroom to ease coughing. Warm showers. Also review the COVID handout for more information.  COVID-19 INFECTION: The incubation period of COVID-19 is approximately 14 days after exposure, with most symptoms developing in roughly 4-5 days. Symptoms may range in severity from mild to critically severe. Roughly 80% of those infected will have mild symptoms. People of any age may become infected with COVID-19 and have the ability to transmit the virus. The most common symptoms include: fever, fatigue, cough, body aches, headaches, sore throat, nasal congestion, shortness of breath, nausea, vomiting, diarrhea, changes in smell and/or taste.    COURSE OF ILLNESS Some patients may begin with mild disease which can progress quickly into critical symptoms. If your symptoms are worsening please call  ahead to the Emergency Department and proceed there for further treatment. Recovery time appears to be roughly 1-2 weeks for mild symptoms and 3-6 weeks for severe disease.   GO IMMEDIATELY TO ER FOR FEVER YOU ARE UNABLE TO GET DOWN WITH TYLENOL, BREATHING PROBLEMS, CHEST PAIN, FATIGUE, LETHARGY, INABILITY TO EAT OR DRINK, ETC  QUARANTINE AND ISOLATION: To help decrease the spread of COVID-19 please remain isolated if you have COVID infection or are highly suspected to have COVID infection. This means -stay home and isolate to one room in the home if you live with others. Do not share a  bed or bathroom with others while ill, sanitize and wipe down all countertops and keep common areas clean and disinfected. You may discontinue isolation if you have a mild case and are asymptomatic 10 days after symptom onset as long as you have been fever free >24 hours without having to take Motrin or Tylenol. If your case is more severe (meaning you develop pneumonia or are admitted in the hospital), you may have to isolate longer.   If you have been in close contact (within 6 feet) of someone diagnosed with COVID 19, you are advised to quarantine in your home for 14 days as symptoms can develop anywhere from 2-14 days after exposure to the virus. If you develop symptoms, you  must isolate.  Most current guidelines for COVID after exposure -isolate 10 days if you ARE NOT tested for COVID as long as symptoms do not develop -isolate 7 days if you are tested and remain asymptomatic -You do not necessarily need to be tested for COVID if you have + exposure and        develop   symptoms. Just isolate at home x10 days from symptom onset During this global pandemic, CDC advises to practice social distancing, try to stay at least 83ft away from others at all times. Wear a face covering. Wash and sanitize your hands regularly and avoid going anywhere that is not necessary.  KEEP IN MIND THAT THE COVID TEST IS NOT 100% ACCURATE AND YOU SHOULD STILL DO EVERYTHING TO PREVENT POTENTIAL SPREAD OF VIRUS TO OTHERS (WEAR MASK, WEAR GLOVES, WASH HANDS AND SANITIZE REGULARLY). IF INITIAL TEST IS NEGATIVE, THIS MAY NOT MEAN YOU ARE DEFINITELY NEGATIVE. MOST ACCURATE TESTING IS DONE 5-7 DAYS AFTER EXPOSURE.   It is not advised by CDC to get re-tested after receiving a positive COVID test since you can still test positive for weeks to months after you have already cleared the virus.   *If you have not been vaccinated for COVID, I strongly suggest you consider getting vaccinated as long as there are no contraindications.        ED Prescriptions    Medication Sig Dispense Auth. Provider   ondansetron (ZOFRAN ODT) 4 MG disintegrating tablet Take 1 tablet (4 mg total) by mouth every 8 (eight) hours as needed for up to 5 days for nausea or vomiting. 15 tablet Gareth Morgan     PDMP not reviewed this encounter.   Shirlee Latch, PA-C 06/23/20 1629

## 2020-06-23 NOTE — ED Triage Notes (Signed)
Patient c/o cough, congestion, fever, fatigue and HAs that started on Thursday.

## 2020-06-24 LAB — SARS CORONAVIRUS 2 (TAT 6-24 HRS): SARS Coronavirus 2: POSITIVE — AB

## 2020-07-18 IMAGING — DX LEFT MIDDLE FINGER 2+V
3 series · 3 of 3 positions shown · non-contrast
Comparison: No recent.

CLINICAL DATA: Pain.  Infection.

EXAM:
LEFT MIDDLE FINGER 2+V

[finger ap]
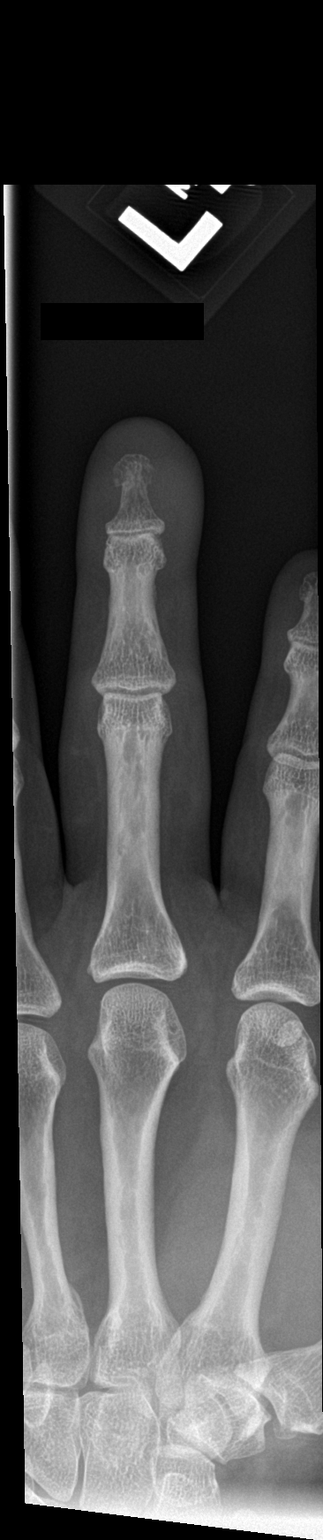

[finger obl]
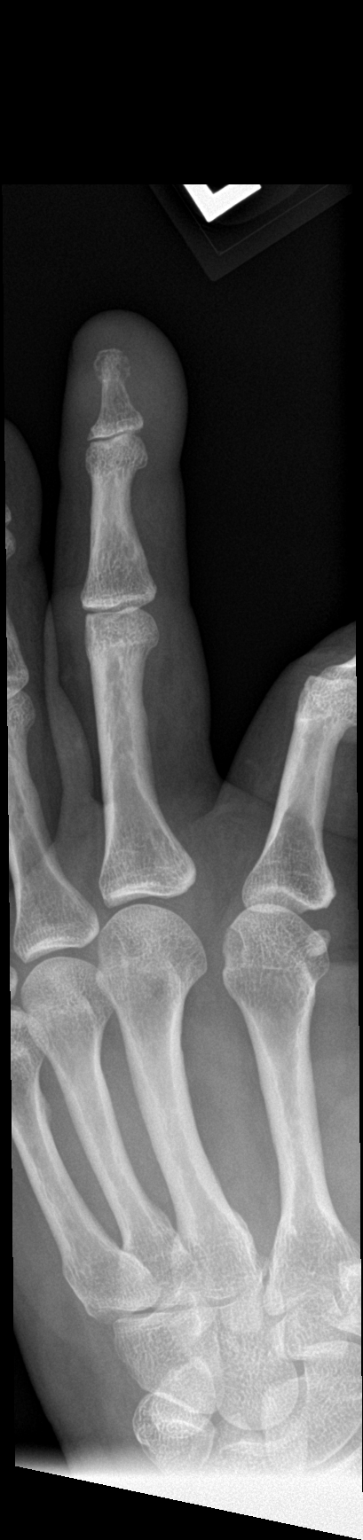

[finger lat]
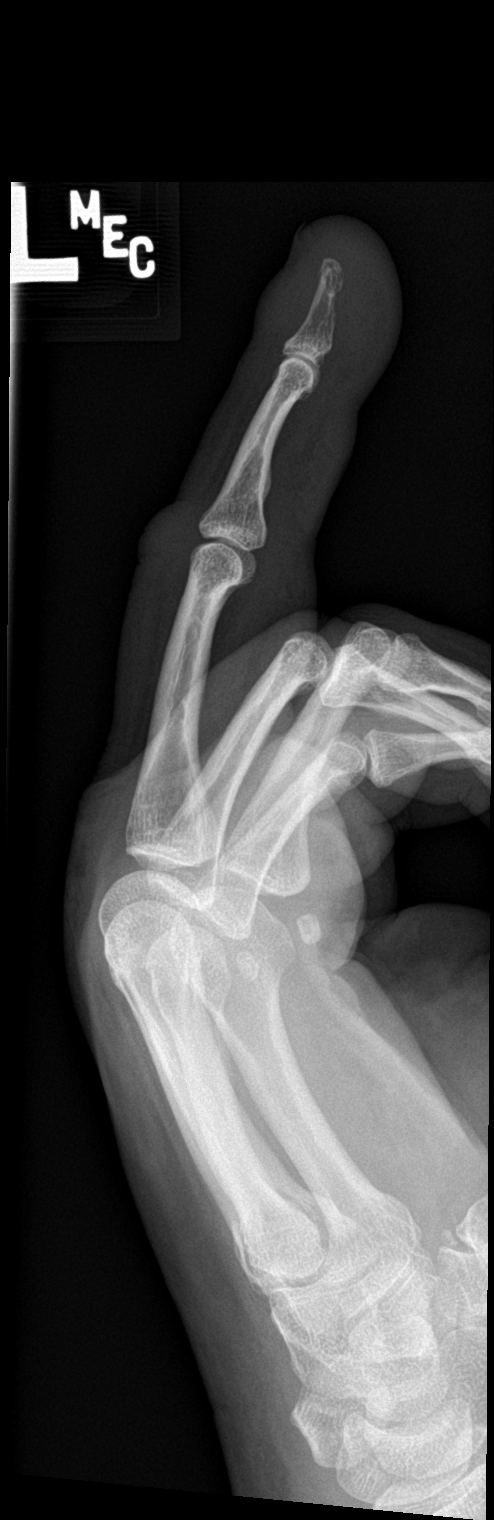

[3 of 3 positions shown; findings below may reference images not displayed]

FINDINGS: No acute bony or joint abnormality identified. No evidence of
fracture or dislocation. No bony erosions noted. Diffuse soft tissue
swelling noted. No radiopaque foreign body.
IMPRESSION: Diffuse soft tissue swelling. No radiopaque foreign body. No acute
bony abnormality. No bony erosive lesions noted.

## 2020-08-08 ENCOUNTER — Other Ambulatory Visit (INDEPENDENT_AMBULATORY_CARE_PROVIDER_SITE_OTHER): Payer: Self-pay | Admitting: Vascular Surgery

## 2020-09-07 ENCOUNTER — Other Ambulatory Visit: Payer: Self-pay

## 2020-09-07 ENCOUNTER — Ambulatory Visit (INDEPENDENT_AMBULATORY_CARE_PROVIDER_SITE_OTHER): Payer: 59 | Admitting: Nurse Practitioner

## 2020-09-07 ENCOUNTER — Ambulatory Visit (INDEPENDENT_AMBULATORY_CARE_PROVIDER_SITE_OTHER): Payer: 59

## 2020-09-07 ENCOUNTER — Encounter (INDEPENDENT_AMBULATORY_CARE_PROVIDER_SITE_OTHER): Payer: Self-pay | Admitting: Nurse Practitioner

## 2020-09-07 ENCOUNTER — Telehealth (INDEPENDENT_AMBULATORY_CARE_PROVIDER_SITE_OTHER): Payer: Self-pay

## 2020-09-07 VITALS — BP 109/75 | HR 94 | Ht 64.0 in | Wt 259.0 lb

## 2020-09-07 DIAGNOSIS — I771 Stricture of artery: Secondary | ICD-10-CM

## 2020-09-07 DIAGNOSIS — N183 Chronic kidney disease, stage 3 unspecified: Secondary | ICD-10-CM

## 2020-09-07 DIAGNOSIS — E1122 Type 2 diabetes mellitus with diabetic chronic kidney disease: Secondary | ICD-10-CM

## 2020-09-07 DIAGNOSIS — F17200 Nicotine dependence, unspecified, uncomplicated: Secondary | ICD-10-CM | POA: Diagnosis not present

## 2020-09-07 DIAGNOSIS — I10 Essential (primary) hypertension: Secondary | ICD-10-CM

## 2020-09-07 NOTE — Progress Notes (Signed)
 Subjective:    Patient ID: Alyssa Maynard, female    DOB: 07/20/1961, 59 y.o.   MRN: [id number] Chief Complaint  Patient presents with  . Follow-up    6mo U/S follow up    Patient returns today on a new referral from her primary care physician for left subclavian artery stenosis.  The patient previously had intervention to the left subclavian artery on 09/08/2019.  The patient notes that for the last couple of months she has been having pain and numbness in her upper extremity.  The patient notes that her arm is very weak and she is unable to do everyday things such as brushing her hair.  She notes that she has periods of severe numbness in her hands.  She denies any ulcerations however.  She notes that her shoulder and arm will ache at random times but it tends to be worse after she is tried to be active.  Today noninvasive symptoms show the left subclavian stent is patent to the mid forearm however there is dampened monophasic flow throughout suggesting a severe stenosis at the subclavian artery region.  The proximal to mid ulnar area shows a short segment occlusion as was shown previously.  There is a collateral present.         Review of Systems  Musculoskeletal:       Arm pain  Neurological: Positive for weakness and numbness.  All other systems reviewed and are negative.      Objective:   Physical Exam Vitals reviewed.  HENT:     Head: Normocephalic.  Cardiovascular:     Rate and Rhythm: Normal rate.     Pulses:          Radial pulses are 0 on the left side.  Pulmonary:     Effort: Pulmonary effort is normal.  Skin:    General: Skin is warm and dry.     Capillary Refill: Capillary refill takes 2 to 3 seconds.  Neurological:     Mental Status: She is alert and oriented to person, place, and time.  Psychiatric:        Mood and Affect: Mood normal.        Behavior: Behavior normal.        Thought Content: Thought content normal.        Judgment: Judgment normal.      BP 109/75   Pulse 94   Ht 5' 4" (1.626 m)   Wt 259 lb (117.5 kg)   LMP 08/27/2015 (LMP Unknown) Comment: abnormal bleeding  BMI 44.46 kg/m   Past Medical History:  Diagnosis Date  . Anxiety   . Chronic kidney disease 04/2017   stage 3  . Complication of anesthesia    pt experiences difficulty breathing after anesthesia due to sleep apnea and smoking  . Depression   . Diabetes mellitus without complication (HCC)   . Dyspnea   . GERD (gastroesophageal reflux disease)   . Hypertension   . Renal insufficiency   . Sleep apnea     Social History   Socioeconomic History  . Marital status: Married    Spouse name: Not on file  . Number of children: Not on file  . Years of education: Not on file  . Highest education level: Not on file  Occupational History  . Not on file  Tobacco Use  . Smoking status: Current Every Day Smoker    Packs/day: 1.00  . Smokeless tobacco: Never Used  Vaping Use  .   Vaping Use: Never used  Substance and Sexual Activity  . Alcohol use: Yes    Alcohol/week: 14.0 standard drinks    Types: 14 Shots of liquor per week  . Drug use: No  . Sexual activity: Not on file  Other Topics Concern  . Not on file  Social History Narrative  . Not on file   Social Determinants of Health   Financial Resource Strain:   . Difficulty of Paying Living Expenses: Not on file  Food Insecurity:   . Worried About Programme researcher, broadcasting/film/video in the Last Year: Not on file  . Ran Out of Food in the Last Year: Not on file  Transportation Needs:   . Lack of Transportation (Medical): Not on file  . Lack of Transportation (Non-Medical): Not on file  Physical Activity:   . Days of Exercise per Week: Not on file  . Minutes of Exercise per Session: Not on file  Stress:   . Feeling of Stress : Not on file  Social Connections:   . Frequency of Communication with Friends and Family: Not on file  . Frequency of Social Gatherings with Friends and Family: Not on file  .  Attends Religious Services: Not on file  . Active Member of Clubs or Organizations: Not on file  . Attends Banker Meetings: Not on file  . Marital Status: Not on file  Intimate Partner Violence:   . Fear of Current or Ex-Partner: Not on file  . Emotionally Abused: Not on file  . Physically Abused: Not on file  . Sexually Abused: Not on file    Past Surgical History:  Procedure Laterality Date  . APPENDECTOMY    . CARDIAC SURGERY    . CAROTID ANGIOGRAPHY N/A 04/27/2017   Procedure: Carotid Angiography;  Surgeon: Annice Needy, MD;  Location: [facility];  Service: Cardiovascular;  Laterality: N/A;  . CHOLECYSTECTOMY    . FOOT SURGERY Left   . HAND SURGERY Right   . HYSTEROSCOPY WITH D & C N/A 11/02/2015   Procedure: DILATATION AND CURETTAGE /HYSTEROSCOPY;  Surgeon: Ala Dach, MD;  Location: [facility];  Service: Gynecology;  Laterality: N/A;  . HYSTEROSCOPY WITH D & C    . LIVER SURGERY    . ORIF TIBIA PLATEAU Right 07/05/2018   Procedure: OPEN REDUCTION INTERNAL FIXATION (ORIF) TIBIAL PLATEAU;  Surgeon: Lyndle Herrlich, MD;  Location: [facility];  Service: Orthopedics;  Laterality: Right;  . TONSILLECTOMY    . UPPER EXTREMITY ANGIOGRAPHY Left 09/08/2019   Procedure: UPPER EXTREMITY ANGIOGRAPHY;  Surgeon: Annice Needy, MD;  Location: [facility];  Service: Cardiovascular;  Laterality: Left;  . UPPER GI ENDOSCOPY      Family History  Problem Relation Age of Onset  . Heart attack Mother   . Stroke Mother   . Heart attack Father   . Cancer Father     Allergies  Allergen Reactions  . Latex Rash and Other (See Comments)    Burns skin   . Rosuvastatin Other (See Comments)  . Clopidogrel Rash    CBC Latest Ref Rng & Units 05/12/2019 07/08/2018 07/07/2018  WBC 4.0 - 10.5 K/uL 9.5 8.4 8.4  Hemoglobin 12.0 - 15.0 g/dL 16.1 11.2(L) 11.8(L)  Hematocrit 36 - 46 % 39.0 32.4(L) 34.8(L)  Platelets 150 - 400 K/uL 350 305 284      CMP     Component  Value Date/Time   NA 137 05/12/2019 0916   K  4.1 05/12/2019 0916   CL 101 05/12/2019 0916   CO2 22 05/12/2019 0916   GLUCOSE 316 (H) 05/12/2019 0916   BUN 21 (H) 09/08/2019 0816   CREATININE 1.23 (H) 09/08/2019 0816   CALCIUM 9.6 05/12/2019 0916   PROT 7.2 05/12/2019 0916   ALBUMIN 3.6 05/12/2019 0916   AST 26 05/12/2019 0916   ALT 21 05/12/2019 0916   ALKPHOS 91 05/12/2019 0916   BILITOT 0.9 05/12/2019 0916   GFRNONAA 48 (L) 09/08/2019 0816   GFRAA 55 (L) 09/08/2019 0816         Assessment & Plan:   1. Subclavian arterial stenosis (HCC) Recommend:  The patient has evidence of severe atherosclerotic changes of her left upper extremity with rest pain that is associated with preulcerative changes and impending tissue loss of the hand.  This represents a limb threatening ischemia and places the patient at the risk for limb loss.  Patient should undergo angiography of the left upper extremity with the hope for intervention for limb salvage.  The risks and benefits as well as the alternative therapies was discussed in detail with the patient.  All questions were answered.  Patient agrees to proceed with angiography.  The patient will follow up with me in the office after the procedure.       2. Tobacco use disorder Smoking cessation was discussed, 3-10 minutes spent on this topic specifically patient is actively trying to stop smoking.   3. Essential hypertension Continue antihypertensive medications as already ordered, these medications have been reviewed and there are no changes at this time.   4. Type 2 diabetes mellitus with stage 3 chronic kidney disease, without long-term current use of insulin, unspecified whether stage 3a or 3b CKD (HCC) Continue hypoglycemic medications as already ordered, these medications have been reviewed and there are no changes at this time.  Hgb A1C to be monitored as already arranged by primary service    Current Outpatient Medications  on File Prior to Visit  Medication Sig Dispense Refill  . aspirin 81 MG chewable tablet Chew 1 tablet (81 mg total) by mouth 2 (two) times daily. 60 tablet 0  . cholecalciferol (VITAMIN D) 1000 units tablet Take 2,000 Units by mouth daily.    [name] esomeprazole (NEXIUM) 40 MG capsule Take 40 mg by mouth daily.    [name] glimepiride (AMARYL) 4 MG tablet Take 4 mg by mouth daily.   0  . Insulin Pen Needle (PEN NEEDLES 31GX5/16") 31G X 8 MM MISC See admin instructions.    [name] lisinopril-hydrochlorothiazide (PRINZIDE,ZESTORETIC) 20-12.5 MG tablet Take 1 tablet by mouth daily.     . pioglitazone (ACTOS) 30 MG tablet Take 1 tablet by mouth daily.  1  . rosuvastatin (CRESTOR) 5 MG tablet TAKE 1 TABLET(5 MG) BY MOUTH DAILY 30 tablet 3  . albuterol (PROAIR HFA) 108 (90 Base) MCG/ACT inhaler once daily as needed. Reported on 10/26/2015 (Patient not taking: Reported on 09/07/2020)    . B-D ULTRA-FINE 33 LANCETS MISC Use 1 Device once daily. (Patient not taking: Reported on 09/07/2020)    . B-D ULTRAFINE III SHORT PEN 31G X 8 MM MISC Inject into the skin as directed.    . cephALEXin (KEFLEX) 500 MG capsule Take 1 capsule (500 mg total) by mouth 4 (four) times daily. (Patient not taking: Reported on 08/30/2019) 40 capsule 0  . clopidogrel (PLAVIX) 75 MG tablet Take 1 tablet (75 mg total) by mouth daily. (Patient not taking: Reported on 04/06/2020) 30 tablet 11  .  docusate sodium (COLACE) 100 MG capsule Take 1 capsule (100 mg total) by mouth 2 (two) times daily. (Patient not taking: Reported on 08/30/2019) 10 capsule 0  . escitalopram (LEXAPRO) 20 MG tablet Take 20 mg by mouth daily. (Patient not taking: Reported on 09/07/2020)    . glucose blood (ONE TOUCH ULTRA TEST) test strip TEST once daily (Patient not taking: Reported on 09/07/2020)    . oxyCODONE (OXYCONTIN) 10 mg 12 hr tablet Take 1 tablet (10 mg total) by mouth every 12 (twelve) hours. (Patient not taking: Reported on 08/30/2019) 10 tablet 0  . OZEMPIC, 0.25 OR 0.5  MG/DOSE, 2 MG/1.5ML SOPN SMARTSIG:0.25 Milligram(s) SUB-Q Once a Week (Patient not taking: Reported on 09/07/2020)    . sitaGLIPtin (JANUVIA) 50 MG tablet Take 50 mg by mouth daily.     [name] sulfamethoxazole-trimethoprim (BACTRIM DS) 800-160 MG tablet Take 1 tablet by mouth 2 (two) times daily. (Patient not taking: Reported on 08/30/2019) 20 tablet 0  . tiZANidine (ZANAFLEX) 2 MG tablet 1-2 tablets at bedtime as needed for muscle spasm (Patient not taking: Reported on 09/07/2020)    . traMADol (ULTRAM) 50 MG tablet Take 50 mg by mouth.  (Patient not taking: Reported on 09/07/2020)    . TRESIBA FLEXTOUCH 100 UNIT/ML FlexTouch Pen SMARTSIG:25 Unit(s) SUB-Q Every Night (Patient not taking: Reported on 09/07/2020)     No current facility-administered medications on file prior to visit.    There are no Patient Instructions on file for this visit. No follow-ups on file.   Georgiana Spinner, NP

## 2020-09-07 NOTE — Telephone Encounter (Signed)
Spoke with the patient and she is scheduled with Dr. Wyn Quaker for a LUE angio on 09/17/20 with a 7:45 am arrival time to the [facility]. Patient does not need covid testing. Pre-procedure instructions were discussed and will be mailed.

## 2020-09-07 NOTE — H&P (View-Only) (Signed)
Subjective:    Patient ID: Alyssa CavalierDonna S Maynard, female    DOB: [birth year], 59 y.o.   MRN: [id number] Chief Complaint  Patient presents with  . Follow-up    82mo U/S follow up    Patient returns today on a new referral from her primary care physician for left subclavian artery stenosis.  The patient previously had intervention to the left subclavian artery on 09/08/2019.  The patient notes that for the last couple of months she has been having pain and numbness in her upper extremity.  The patient notes that her arm is very weak and she is unable to do everyday things such as brushing her hair.  She notes that she has periods of severe numbness in her hands.  She denies any ulcerations however.  She notes that her shoulder and arm will ache at random times but it tends to be worse after she is tried to be active.  Today noninvasive symptoms show the left subclavian stent is patent to the mid forearm however there is dampened monophasic flow throughout suggesting a severe stenosis at the subclavian artery region.  The proximal to mid ulnar area shows a short segment occlusion as was shown previously.  There is a collateral present.         Review of Systems  Musculoskeletal:       Arm pain  Neurological: Positive for weakness and numbness.  All other systems reviewed and are negative.      Objective:   Physical Exam Vitals reviewed.  HENT:     Head: Normocephalic.  Cardiovascular:     Rate and Rhythm: Normal rate.     Pulses:          Radial pulses are 0 on the left side.  Pulmonary:     Effort: Pulmonary effort is normal.  Skin:    General: Skin is warm and dry.     Capillary Refill: Capillary refill takes 2 to 3 seconds.  Neurological:     Mental Status: She is alert and oriented to person, place, and time.  Psychiatric:        Mood and Affect: Mood normal.        Behavior: Behavior normal.        Thought Content: Thought content normal.        Judgment: Judgment normal.      BP 109/75   Pulse 94   Ht 5\' 4"  (1.626 m)   Wt 259 lb (117.5 kg)   LMP 08/27/2015 (LMP Unknown) Comment: abnormal bleeding  BMI 44.46 kg/m   Past Medical History:  Diagnosis Date  . Anxiety   . Chronic kidney disease 04/2017   stage 3  . Complication of anesthesia    pt experiences difficulty breathing after anesthesia due to sleep apnea and smoking  . Depression   . Diabetes mellitus without complication (HCC)   . Dyspnea   . GERD (gastroesophageal reflux disease)   . Hypertension   . Renal insufficiency   . Sleep apnea     Social History   Socioeconomic History  . Marital status: Married    Spouse name: Not on file  . Number of children: Not on file  . Years of education: Not on file  . Highest education level: Not on file  Occupational History  . Not on file  Tobacco Use  . Smoking status: Current Every Day Smoker    Packs/day: 1.00  . Smokeless tobacco: Never Used  Vaping Use  .  Vaping Use: Never used  Substance and Sexual Activity  . Alcohol use: Yes    Alcohol/week: 14.0 standard drinks    Types: 14 Shots of liquor per week  . Drug use: No  . Sexual activity: Not on file  Other Topics Concern  . Not on file  Social History Narrative  . Not on file   Social Determinants of Health   Financial Resource Strain:   . Difficulty of Paying Living Expenses: Not on file  Food Insecurity:   . Worried About Programme researcher, broadcasting/film/video in the Last Year: Not on file  . Ran Out of Food in the Last Year: Not on file  Transportation Needs:   . Lack of Transportation (Medical): Not on file  . Lack of Transportation (Non-Medical): Not on file  Physical Activity:   . Days of Exercise per Week: Not on file  . Minutes of Exercise per Session: Not on file  Stress:   . Feeling of Stress : Not on file  Social Connections:   . Frequency of Communication with Friends and Family: Not on file  . Frequency of Social Gatherings with Friends and Family: Not on file  .  Attends Religious Services: Not on file  . Active Member of Clubs or Organizations: Not on file  . Attends Banker Meetings: Not on file  . Marital Status: Not on file  Intimate Partner Violence:   . Fear of Current or Ex-Partner: Not on file  . Emotionally Abused: Not on file  . Physically Abused: Not on file  . Sexually Abused: Not on file    Past Surgical History:  Procedure Laterality Date  . APPENDECTOMY    . CARDIAC SURGERY    . CAROTID ANGIOGRAPHY N/A 04/27/2017   Procedure: Carotid Angiography;  Surgeon: Annice Needy, MD;  Location: [facility];  Service: Cardiovascular;  Laterality: N/A;  . CHOLECYSTECTOMY    . FOOT SURGERY Left   . HAND SURGERY Right   . HYSTEROSCOPY WITH D & C N/A 11/02/2015   Procedure: DILATATION AND CURETTAGE /HYSTEROSCOPY;  Surgeon: Ala Dach, MD;  Location: [facility];  Service: Gynecology;  Laterality: N/A;  . HYSTEROSCOPY WITH D & C    . LIVER SURGERY    . ORIF TIBIA PLATEAU Right 07/05/2018   Procedure: OPEN REDUCTION INTERNAL FIXATION (ORIF) TIBIAL PLATEAU;  Surgeon: Lyndle Herrlich, MD;  Location: [facility];  Service: Orthopedics;  Laterality: Right;  . TONSILLECTOMY    . UPPER EXTREMITY ANGIOGRAPHY Left 09/08/2019   Procedure: UPPER EXTREMITY ANGIOGRAPHY;  Surgeon: Annice Needy, MD;  Location: [facility];  Service: Cardiovascular;  Laterality: Left;  . UPPER GI ENDOSCOPY      Family History  Problem Relation Age of Onset  . Heart attack Mother   . Stroke Mother   . Heart attack Father   . Cancer Father     Allergies  Allergen Reactions  . Latex Rash and Other (See Comments)    Burns skin   . Rosuvastatin Other (See Comments)  . Clopidogrel Rash    CBC Latest Ref Rng & Units 05/12/2019 07/08/2018 07/07/2018  WBC 4.0 - 10.5 K/uL 9.5 8.4 8.4  Hemoglobin 12.0 - 15.0 g/dL 16.1 11.2(L) 11.8(L)  Hematocrit 36 - 46 % 39.0 32.4(L) 34.8(L)  Platelets 150 - 400 K/uL 350 305 284      CMP     Component  Value Date/Time   NA 137 05/12/2019 0916   K  4.1 05/12/2019 0916   CL 101 05/12/2019 0916   CO2 22 05/12/2019 0916   GLUCOSE 316 (H) 05/12/2019 0916   BUN 21 (H) 09/08/2019 0816   CREATININE 1.23 (H) 09/08/2019 0816   CALCIUM 9.6 05/12/2019 0916   PROT 7.2 05/12/2019 0916   ALBUMIN 3.6 05/12/2019 0916   AST 26 05/12/2019 0916   ALT 21 05/12/2019 0916   ALKPHOS 91 05/12/2019 0916   BILITOT 0.9 05/12/2019 0916   GFRNONAA 48 (L) 09/08/2019 0816   GFRAA 55 (L) 09/08/2019 0816         Assessment & Plan:   1. Subclavian arterial stenosis (HCC) Recommend:  The patient has evidence of severe atherosclerotic changes of her left upper extremity with rest pain that is associated with preulcerative changes and impending tissue loss of the hand.  This represents a limb threatening ischemia and places the patient at the risk for limb loss.  Patient should undergo angiography of the left upper extremity with the hope for intervention for limb salvage.  The risks and benefits as well as the alternative therapies was discussed in detail with the patient.  All questions were answered.  Patient agrees to proceed with angiography.  The patient will follow up with me in the office after the procedure.       2. Tobacco use disorder Smoking cessation was discussed, 3-10 minutes spent on this topic specifically patient is actively trying to stop smoking.   3. Essential hypertension Continue antihypertensive medications as already ordered, these medications have been reviewed and there are no changes at this time.   4. Type 2 diabetes mellitus with stage 3 chronic kidney disease, without long-term current use of insulin, unspecified whether stage 3a or 3b CKD (HCC) Continue hypoglycemic medications as already ordered, these medications have been reviewed and there are no changes at this time.  Hgb A1C to be monitored as already arranged by primary service    Current Outpatient Medications  on File Prior to Visit  Medication Sig Dispense Refill  . aspirin 81 MG chewable tablet Chew 1 tablet (81 mg total) by mouth 2 (two) times daily. 60 tablet 0  . cholecalciferol (VITAMIN D) 1000 units tablet Take 2,000 Units by mouth daily.    [name] esomeprazole (NEXIUM) 40 MG capsule Take 40 mg by mouth daily.    [name] glimepiride (AMARYL) 4 MG tablet Take 4 mg by mouth daily.   0  . Insulin Pen Needle (PEN NEEDLES 31GX5/16") 31G X 8 MM MISC See admin instructions.    [name] lisinopril-hydrochlorothiazide (PRINZIDE,ZESTORETIC) 20-12.5 MG tablet Take 1 tablet by mouth daily.     . pioglitazone (ACTOS) 30 MG tablet Take 1 tablet by mouth daily.  1  . rosuvastatin (CRESTOR) 5 MG tablet TAKE 1 TABLET(5 MG) BY MOUTH DAILY 30 tablet 3  . albuterol (PROAIR HFA) 108 (90 Base) MCG/ACT inhaler once daily as needed. Reported on 10/26/2015 (Patient not taking: Reported on 09/07/2020)    . B-D ULTRA-FINE 33 LANCETS MISC Use 1 Device once daily. (Patient not taking: Reported on 09/07/2020)    . B-D ULTRAFINE III SHORT PEN 31G X 8 MM MISC Inject into the skin as directed.    . cephALEXin (KEFLEX) 500 MG capsule Take 1 capsule (500 mg total) by mouth 4 (four) times daily. (Patient not taking: Reported on 08/30/2019) 40 capsule 0  . clopidogrel (PLAVIX) 75 MG tablet Take 1 tablet (75 mg total) by mouth daily. (Patient not taking: Reported on 04/06/2020) 30 tablet 11  .  docusate sodium (COLACE) 100 MG capsule Take 1 capsule (100 mg total) by mouth 2 (two) times daily. (Patient not taking: Reported on 08/30/2019) 10 capsule 0  . escitalopram (LEXAPRO) 20 MG tablet Take 20 mg by mouth daily. (Patient not taking: Reported on 09/07/2020)    . glucose blood (ONE TOUCH ULTRA TEST) test strip TEST once daily (Patient not taking: Reported on 09/07/2020)    . oxyCODONE (OXYCONTIN) 10 mg 12 hr tablet Take 1 tablet (10 mg total) by mouth every 12 (twelve) hours. (Patient not taking: Reported on 08/30/2019) 10 tablet 0  . OZEMPIC, 0.25 OR 0.5  MG/DOSE, 2 MG/1.5ML SOPN SMARTSIG:0.25 Milligram(s) SUB-Q Once a Week (Patient not taking: Reported on 09/07/2020)    . sitaGLIPtin (JANUVIA) 50 MG tablet Take 50 mg by mouth daily.     [name] sulfamethoxazole-trimethoprim (BACTRIM DS) 800-160 MG tablet Take 1 tablet by mouth 2 (two) times daily. (Patient not taking: Reported on 08/30/2019) 20 tablet 0  . tiZANidine (ZANAFLEX) 2 MG tablet 1-2 tablets at bedtime as needed for muscle spasm (Patient not taking: Reported on 09/07/2020)    . traMADol (ULTRAM) 50 MG tablet Take 50 mg by mouth.  (Patient not taking: Reported on 09/07/2020)    . TRESIBA FLEXTOUCH 100 UNIT/ML FlexTouch Pen SMARTSIG:25 Unit(s) SUB-Q Every Night (Patient not taking: Reported on 09/07/2020)     No current facility-administered medications on file prior to visit.    There are no Patient Instructions on file for this visit. No follow-ups on file.   Georgiana Spinner, NP

## 2020-09-13 ENCOUNTER — Other Ambulatory Visit: Payer: 59 | Attending: Vascular Surgery

## 2020-09-17 ENCOUNTER — Other Ambulatory Visit: Payer: Self-pay

## 2020-09-17 ENCOUNTER — Encounter: Payer: Self-pay | Admitting: Vascular Surgery

## 2020-09-17 ENCOUNTER — Other Ambulatory Visit (INDEPENDENT_AMBULATORY_CARE_PROVIDER_SITE_OTHER): Payer: Self-pay | Admitting: Nurse Practitioner

## 2020-09-17 ENCOUNTER — Telehealth (INDEPENDENT_AMBULATORY_CARE_PROVIDER_SITE_OTHER): Payer: Self-pay

## 2020-09-17 ENCOUNTER — Ambulatory Visit
Admission: RE | Admit: 2020-09-17 | Discharge: 2020-09-17 | Disposition: A | Discharge status: Home / Self Care | Payer: 59 | Attending: Vascular Surgery | Admitting: Vascular Surgery

## 2020-09-17 ENCOUNTER — Encounter
Admission: RE | Disposition: A | Discharge status: Home / Self Care | Payer: Self-pay | Source: Home / Self Care | Attending: Vascular Surgery

## 2020-09-17 DIAGNOSIS — Z7982 Long term (current) use of aspirin: Secondary | ICD-10-CM | POA: Diagnosis not present

## 2020-09-17 DIAGNOSIS — E1122 Type 2 diabetes mellitus with diabetic chronic kidney disease: Secondary | ICD-10-CM | POA: Diagnosis not present

## 2020-09-17 DIAGNOSIS — M79602 Pain in left arm: Secondary | ICD-10-CM | POA: Diagnosis not present

## 2020-09-17 DIAGNOSIS — Z7902 Long term (current) use of antithrombotics/antiplatelets: Secondary | ICD-10-CM | POA: Diagnosis not present

## 2020-09-17 DIAGNOSIS — N183 Chronic kidney disease, stage 3 unspecified: Secondary | ICD-10-CM | POA: Insufficient documentation

## 2020-09-17 DIAGNOSIS — I129 Hypertensive chronic kidney disease with stage 1 through stage 4 chronic kidney disease, or unspecified chronic kidney disease: Secondary | ICD-10-CM | POA: Insufficient documentation

## 2020-09-17 DIAGNOSIS — I771 Stricture of artery: Secondary | ICD-10-CM

## 2020-09-17 DIAGNOSIS — Z79899 Other long term (current) drug therapy: Secondary | ICD-10-CM | POA: Diagnosis not present

## 2020-09-17 DIAGNOSIS — Z91040 Latex allergy status: Secondary | ICD-10-CM | POA: Diagnosis not present

## 2020-09-17 DIAGNOSIS — Z794 Long term (current) use of insulin: Secondary | ICD-10-CM | POA: Diagnosis not present

## 2020-09-17 DIAGNOSIS — F17210 Nicotine dependence, cigarettes, uncomplicated: Secondary | ICD-10-CM | POA: Insufficient documentation

## 2020-09-17 DIAGNOSIS — Z888 Allergy status to other drugs, medicaments and biological substances status: Secondary | ICD-10-CM | POA: Insufficient documentation

## 2020-09-17 HISTORY — PX: UPPER EXTREMITY ANGIOGRAPHY: CATH118270

## 2020-09-17 LAB — BASIC METABOLIC PANEL
Anion gap: 14 (ref 5–15)
BUN: 21 mg/dL (ref 8–23)
CO2: 21 mmol/L — ABNORMAL LOW (ref 22–32)
Calcium: 9.9 mg/dL (ref 8.9–10.3)
Chloride: 105 mmol/L (ref 98–111)
Creatinine, Ser: 1.19 mg/dL — ABNORMAL HIGH (ref 0.44–1.00)
GFR, Estimated: 52 mL/min — ABNORMAL LOW (ref >60)
Glucose, Bld: 126 mg/dL — ABNORMAL HIGH (ref 70–99)
Potassium: 3.9 mmol/L (ref 3.5–5.1)
Sodium: 140 mmol/L (ref 135–145)

## 2020-09-17 SURGERY — UPPER EXTREMITY ANGIOGRAPHY
Anesthesia: Moderate Sedation

## 2020-09-17 MED ORDER — MIDAZOLAM HCL 5 MG/5ML IJ SOLN
INTRAMUSCULAR | Status: AC
  Filled 2020-09-17: qty 5

## 2020-09-17 MED ORDER — FENTANYL CITRATE (PF) 100 MCG/2ML IJ SOLN
INTRAMUSCULAR | Status: AC
  Filled 2020-09-17: qty 2

## 2020-09-17 MED ORDER — SODIUM CHLORIDE 0.9 % IV SOLN: INTRAVENOUS | Status: DC

## 2020-09-17 MED ORDER — MIDAZOLAM HCL 2 MG/ML PO SYRP: 8.0000 mg | ORAL_SOLUTION | Freq: Once | ORAL | Status: DC | PRN

## 2020-09-17 MED ORDER — APIXABAN 5 MG PO TABS: 5.0000 mg | ORAL_TABLET | Freq: Two times a day (BID) | ORAL | 2 refills | Status: DC

## 2020-09-17 MED ORDER — IODIXANOL 320 MG/ML IV SOLN
INTRAVENOUS | Status: DC | PRN
  Administered 2020-09-17: 75 mL via INTRA_ARTERIAL

## 2020-09-17 MED ORDER — METHYLPREDNISOLONE SODIUM SUCC 125 MG IJ SOLR: 125.0000 mg | Freq: Once | INTRAMUSCULAR | Status: DC | PRN

## 2020-09-17 MED ORDER — ONDANSETRON 4 MG PO TBDP
ORAL_TABLET | ORAL | Status: AC
  Filled 2020-09-17: qty 1

## 2020-09-17 MED ORDER — HEPARIN SODIUM (PORCINE) 1000 UNIT/ML IJ SOLN
INTRAMUSCULAR | Status: AC
  Filled 2020-09-17: qty 1

## 2020-09-17 MED ORDER — DIPHENHYDRAMINE HCL 50 MG/ML IJ SOLN: 50.0000 mg | Freq: Once | INTRAMUSCULAR | Status: DC | PRN

## 2020-09-17 MED ORDER — FENTANYL CITRATE (PF) 100 MCG/2ML IJ SOLN
INTRAMUSCULAR | Status: DC | PRN
  Administered 2020-09-17 (×3): 50 ug via INTRAVENOUS
  Administered 2020-09-17: 25 ug via INTRAVENOUS

## 2020-09-17 MED ORDER — ONDANSETRON HCL 4 MG/2ML IJ SOLN: 4.0000 mg | Freq: Four times a day (QID) | INTRAMUSCULAR | Status: DC | PRN

## 2020-09-17 MED ORDER — CEFAZOLIN SODIUM-DEXTROSE 2-4 GM/100ML-% IV SOLN: 2.0000 g | Freq: Once | INTRAVENOUS | Status: DC

## 2020-09-17 MED ORDER — FAMOTIDINE 20 MG PO TABS: 40.0000 mg | ORAL_TABLET | Freq: Once | ORAL | Status: DC | PRN

## 2020-09-17 MED ORDER — HEPARIN (PORCINE) IN NACL 2000-0.9 UNIT/L-% IV SOLN
INTRAVENOUS | Status: DC | PRN
  Administered 2020-09-17: 5000 mL via INTRAVENOUS

## 2020-09-17 MED ORDER — HYDROMORPHONE HCL 1 MG/ML IJ SOLN: 1.0000 mg | Freq: Once | INTRAMUSCULAR | Status: DC | PRN

## 2020-09-17 MED ORDER — MIDAZOLAM HCL 2 MG/2ML IJ SOLN
INTRAMUSCULAR | Status: DC | PRN
  Administered 2020-09-17 (×3): 2 mg via INTRAVENOUS
  Administered 2020-09-17: 1 mg via INTRAVENOUS

## 2020-09-17 MED ORDER — ONDANSETRON 4 MG PO TBDP
ORAL_TABLET | ORAL | Status: DC | PRN
  Administered 2020-09-17: 4 mg via ORAL

## 2020-09-17 SURGICAL SUPPLY — 17 items
BALLN LUTONIX 7X80X130 (BALLOONS) ×2
BALLOON LUTONIX 7X80X130 (BALLOONS) IMPLANT
CATH ANGIO 5F PIGTAIL 100CM (CATHETERS) ×1 IMPLANT
CATH BEACON 5 .035 100 H1 TIP (CATHETERS) ×1 IMPLANT
DEVICE STARCLOSE SE CLOSURE (Vascular Products) ×1 IMPLANT
DEVICE TORQUE .025-.038 (MISCELLANEOUS) ×1 IMPLANT
GLIDEWIRE ANGLED SS 035X260CM (WIRE) ×1 IMPLANT
GUIDEWIRE SUPER STIFF .035X180 (WIRE) ×1 IMPLANT
KIT ENCORE 26 ADVANTAGE (KITS) ×1 IMPLANT
PACK ANGIOGRAPHY (CUSTOM PROCEDURE TRAY) ×1 IMPLANT
SHEATH BRITE TIP 4FRX11 (SHEATH) ×1 IMPLANT
SHEATH BRITE TIP 5FRX11 (SHEATH) ×1 IMPLANT
SHEATH PINNACLE ST 7F 65CM (SHEATH) ×1 IMPLANT
STENT LIFESTREAM 8X37X80 (Permanent Stent) ×1 IMPLANT
VALVE HEMO TOUHY BORST Y (ADAPTER) ×1 IMPLANT
WIRE J 3MM .035X145CM (WIRE) ×1 IMPLANT
WIRE MAGIC TORQUE 315CM (WIRE) ×1 IMPLANT

## 2020-09-17 NOTE — OR Nursing (Signed)
Half of antibiotic infused, IV painful so it was stopped. Dr Wyn Quaker aware that the post procedure IV start attempt was unsuccessful. Ok to leave it alone, 1 gram was infused already.

## 2020-09-17 NOTE — Op Note (Signed)
OPERATIVE REPORT   PREOPERATIVE DIAGNOSIS: 1. Left subclavian artery stenosis/occlusion, left arm pain  POSTOPERATIVE DIAGNOSIS: Same as above  PROCEDURE PERFORMED: 1. Ultrasound guidance vascular access to right femoral artery. 2. Catheter placement to left brachial artery  from right femoral approach. 3. Thoracic aortogram and selective left upper extremity angiogram 4.  Percutaneous transluminal angioplasty of the left subclavian artery with 7 mm diameter Lutonix drug-coated angioplasty balloon 5.  Lifestream stent placement to the left subclavian artery with 8 mm diameter by 37 mm length lifestream stent 6. StarClose closure device right femoral artery.  SURGEON: Algernon Huxley, MD  ANESTHESIA: Local with moderate conscious sedation for 53 minutes using 7 mg of Versed and 175 mcg of Fentanyl (although the first 4 mg of Versed and 150 mcg of fentanyl were given through a peripheral IV that infiltrated.  The remainder was given through the sheath.)  BLOOD LOSS: Minimal.  FLUOROSCOPY TIME:6.6  INDICATION FOR PROCEDURE: This is a 59 y.o.female who presented to our office with recurrent left hand pain and numbness.  Noninvasive studies shows her previous subclavian stent to be stenotic or occluded. To further evaluate this to determine what options would be possible to treat the subclavian steal syndrome, angiogram of the left upper extremity is indicated. Risks and benefits are discussed. Informed consent was obtained.  DESCRIPTION OF PROCEDURE: The patient was brought to the vascular suite. Moderate conscious sedation was administered during a face to face encounter with the patient throughout the procedure with my supervision of the RN administering medicines and monitoring the patient's vital signs, pulse oximetry, telemetry and mental status throughout from the start of the procedure until the patient was taken to the recovery room.  Groins were shaved and  prepped and sterile surgical field was created. The right femoral head was localized with fluoroscopy and the right femoral artery was then visualized with ultrasound and found to be widely patent. It was then accessed under direct ultrasound guidance without difficulty with a Seldinger needle and a permanent image was recorded. A J-wire and 5-French sheath were then placed. Pigtail catheter was placed into the ascending aorta and a thoracic aortogram was then performed in the LAO projection. This demonstrated normal origins to the great vessels with minimal flow seen in the left subclavian stent. The patient was given 5000 units of intravenous heparin and a headhunter catheter was used to selectively cannulate the left subclavian artery without difficulty.  Selective imaging showed either an occlusion or only a trickle of flow through the stent although is a little difficult to discern.  Navigating a Glidewire through this was slightly tedious but with slow advancement were able to get through the occlusion and into the more distal subclavian artery where imaging was initially performed.  I then advanced down to the brachial artery to opacify distally.  The radial artery was large and patent into the hand.  The interosseous artery was large.  The ulnar artery was occluded flush at its origin without visualization although we did see distal reconstitution through the palmar arch and the more distal ulnar artery.  I then placed a Magic torque wire and parked this in the radial artery.  Initially, a 7 mm diameter Lutonix drug-coated angioplasty balloon was inflated in the proximal left subclavian artery to treat the in-stent stenosis.  This was inflated to 8 atm for 1 minute.  Completion imaging showed greater than 50% residual stenosis within and just distal to the stent.  I elected to restent and extend  the stent distally staying just below the vertebral artery.  We upsized to a 65 cm 7 [place] sheath.   This terminated in the proximal left subclavian artery just within the stent.  I then selected an 8 mm diameter by 37 mm length lifestream stent and deployed this from just proximal to the left vertebral artery down to the origin of the subclavian artery.  This is inflated to 10 atm.  Completion imaging showed markedly improved flow with less than 10% residual stenosis.  I elected not to try to treat her chronic ulnar occlusion today. The diagnostic catheter was removed. Oblique arteriogram was performed of the right femoral artery and StarClose closure device deployed in the usual fashion with excellent hemostatic result. The patient tolerated the procedure well and was taken to the recovery room in stable condition.   Leotis Pain 09/17/2020 9:35 AM

## 2020-09-17 NOTE — Telephone Encounter (Signed)
I called the pt and her husband is on his way to [place] to pick it, so no need to call into [facility].

## 2020-09-17 NOTE — Discharge Instructions (Signed)
Angiogram, Care After This sheet gives you information about how to care for yourself after your procedure. Your health care provider may also give you more specific instructions. If you have problems or questions, contact your health care provider. What can I expect after the procedure? After the procedure, it is common to have bruising and tenderness at the catheter insertion area. Follow these instructions at home: Insertion site care  Follow instructions from your health care provider about how to take care of your insertion site. Make sure you: ? Wash your hands with soap and water before you change your bandage (dressing). If soap and water are not available, use hand sanitizer. ? Change your dressing as told by your health care provider. ? Leave stitches (sutures), skin glue, or adhesive strips in place. These skin closures may need to stay in place for 2 weeks or longer. If adhesive strip edges start to loosen and curl up, you may trim the loose edges. Do not remove adhesive strips completely unless your health care provider tells you to do that.  Do not take baths, swim, or use a hot tub until your health care provider approves.  You may shower 24-48 hours after the procedure or as told by your health care provider. ? Gently wash the site with plain soap and water. ? Pat the area dry with a clean towel. ? Do not rub the site. This may cause bleeding.  Do not apply powder or lotion to the site. Keep the site clean and dry.  Check your insertion site every day for signs of infection. Check for: ? Redness, swelling, or pain. ? Fluid or blood. ? Warmth. ? Pus or a bad smell. Activity  Rest as told by your health care provider, usually for 1-2 days.  Do not lift anything that is heavier than 10 lbs. (4.5 kg) or as told by your health care provider.  Do not drive for 24 hours if you were given a medicine to help you relax (sedative).  Do not drive or use heavy machinery while  taking prescription pain medicine. General instructions   Return to your normal activities as told by your health care provider, usually in about a week. Ask your health care provider what activities are safe for you.  If the catheter site starts bleeding, lie flat and put pressure on the site. If the bleeding does not stop, get help right away. This is a medical emergency.  Drink enough fluid to keep your urine clear or pale yellow. This helps flush the contrast dye from your body.  Take over-the-counter and prescription medicines only as told by your health care provider.  Keep all follow-up visits as told by your health care provider. This is important. Contact a health care provider if:  You have a fever or chills.  You have redness, swelling, or pain around your insertion site.  You have fluid or blood coming from your insertion site.  The insertion site feels warm to the touch.  You have pus or a bad smell coming from your insertion site.  You have bruising around the insertion site.  You notice blood collecting in the tissue around the catheter site (hematoma). The hematoma may be painful to the touch. Get help right away if:  You have severe pain at the catheter insertion area.  The catheter insertion area swells very fast.  The catheter insertion area is bleeding, and the bleeding does not stop when you hold steady pressure on the area.    The area near or just beyond the catheter insertion site becomes pale, cool, tingly, or numb. These symptoms may represent a serious problem that is an emergency. Do not wait to see if the symptoms will go away. Get medical help right away. Call your local emergency services (911 in the [place] Do not drive yourself to the hospital. Summary  After the procedure, it is common to have bruising and tenderness at the catheter insertion area.  After the procedure, it is important to rest and drink plenty of fluids.  Do not take baths,  swim, or use a hot tub until your health care provider says it is okay to do so. You may shower 24-48 hours after the procedure or as told by your health care provider.  If the catheter site starts bleeding, lie flat and put pressure on the site. If the bleeding does not stop, get help right away. This is a medical emergency. This information is not intended to replace advice given to you by your health care provider. Make sure you discuss any questions you have with your health care provider. Document Revised: 10/02/2017 Document Reviewed: 09/24/2016 Elsevier Patient Education  2020 [employer]vier Inc.  

## 2020-09-17 NOTE — Telephone Encounter (Signed)
It appears it was sent to [facility] in [place] originally...please call to cancel the rx in [place] and I will send it into [place]

## 2020-09-17 NOTE — Telephone Encounter (Signed)
Pt called and left a Message on the nurses line she had an upper extremity angio and wanted to know could we send her Rx for eliquis  over to [facility] in [place].Please advise.

## 2020-09-17 NOTE — Interval H&P Note (Signed)
History and Physical Interval Note:  09/17/2020 8:10 AM  Alyssa Maynard  has presented today for surgery, with the diagnosis of LT upper extremity angio    Subclavian artery stenosis.  The various methods of treatment have been discussed with the patient and family. After consideration of risks, benefits and other options for treatment, the patient has consented to  Procedure(s): UPPER EXTREMITY ANGIOGRAPHY (N/A) as a surgical intervention.  The patient's history has been reviewed, patient examined, no change in status, stable for surgery.  I have reviewed the patient's chart and labs.  Questions were answered to the patient's satisfaction.     Festus Barren

## 2020-09-17 NOTE — Telephone Encounter (Signed)
Spoke with the patient's spouse regarding the patient's prescription for Eliquis after her angio procedure today. The prescription was sent to [facility] and per the spouse he wanted the pharmacy to be [facility]. This was changed in the system and the spouse stated he would go pick up the prescription in [place].

## 2020-10-08 ENCOUNTER — Other Ambulatory Visit (INDEPENDENT_AMBULATORY_CARE_PROVIDER_SITE_OTHER): Payer: Self-pay | Admitting: Vascular Surgery

## 2020-10-08 DIAGNOSIS — M79606 Pain in leg, unspecified: Secondary | ICD-10-CM

## 2020-10-08 DIAGNOSIS — I771 Stricture of artery: Secondary | ICD-10-CM

## 2020-10-08 DIAGNOSIS — Z95820 Peripheral vascular angioplasty status with implants and grafts: Secondary | ICD-10-CM

## 2020-10-12 ENCOUNTER — Ambulatory Visit (INDEPENDENT_AMBULATORY_CARE_PROVIDER_SITE_OTHER): Payer: 59 | Admitting: Nurse Practitioner

## 2020-10-12 ENCOUNTER — Ambulatory Visit (INDEPENDENT_AMBULATORY_CARE_PROVIDER_SITE_OTHER): Payer: 59

## 2020-10-12 ENCOUNTER — Other Ambulatory Visit: Payer: Self-pay

## 2020-10-12 DIAGNOSIS — M79606 Pain in leg, unspecified: Secondary | ICD-10-CM

## 2020-10-12 DIAGNOSIS — M79605 Pain in left leg: Secondary | ICD-10-CM | POA: Diagnosis not present

## 2020-10-12 DIAGNOSIS — I771 Stricture of artery: Secondary | ICD-10-CM

## 2020-10-12 DIAGNOSIS — Z95820 Peripheral vascular angioplasty status with implants and grafts: Secondary | ICD-10-CM

## 2020-10-16 ENCOUNTER — Other Ambulatory Visit (INDEPENDENT_AMBULATORY_CARE_PROVIDER_SITE_OTHER): Payer: Self-pay | Admitting: Vascular Surgery

## 2020-10-16 ENCOUNTER — Ambulatory Visit (INDEPENDENT_AMBULATORY_CARE_PROVIDER_SITE_OTHER): Payer: 59 | Admitting: Vascular Surgery

## 2020-10-16 ENCOUNTER — Other Ambulatory Visit (INDEPENDENT_AMBULATORY_CARE_PROVIDER_SITE_OTHER): Payer: 59

## 2020-10-16 DIAGNOSIS — I771 Stricture of artery: Secondary | ICD-10-CM

## 2020-10-16 DIAGNOSIS — Z95820 Peripheral vascular angioplasty status with implants and grafts: Secondary | ICD-10-CM

## 2020-10-17 ENCOUNTER — Encounter (INDEPENDENT_AMBULATORY_CARE_PROVIDER_SITE_OTHER): Payer: Self-pay | Admitting: *Deleted

## 2020-11-06 ENCOUNTER — Ambulatory Visit (INDEPENDENT_AMBULATORY_CARE_PROVIDER_SITE_OTHER): Payer: 59 | Admitting: Vascular Surgery

## 2020-11-06 ENCOUNTER — Other Ambulatory Visit (INDEPENDENT_AMBULATORY_CARE_PROVIDER_SITE_OTHER): Payer: 59

## 2020-11-06 ENCOUNTER — Encounter (INDEPENDENT_AMBULATORY_CARE_PROVIDER_SITE_OTHER): Payer: 59

## 2020-11-09 ENCOUNTER — Ambulatory Visit (INDEPENDENT_AMBULATORY_CARE_PROVIDER_SITE_OTHER): Payer: 59 | Admitting: Vascular Surgery

## 2020-11-09 ENCOUNTER — Other Ambulatory Visit (INDEPENDENT_AMBULATORY_CARE_PROVIDER_SITE_OTHER): Payer: 59

## 2020-11-20 ENCOUNTER — Other Ambulatory Visit (INDEPENDENT_AMBULATORY_CARE_PROVIDER_SITE_OTHER): Payer: Self-pay | Admitting: Nurse Practitioner

## 2020-11-26 ENCOUNTER — Other Ambulatory Visit (INDEPENDENT_AMBULATORY_CARE_PROVIDER_SITE_OTHER): Payer: Self-pay | Admitting: Nurse Practitioner

## 2021-01-14 ENCOUNTER — Other Ambulatory Visit: Payer: Self-pay | Admitting: Internal Medicine

## 2021-01-14 DIAGNOSIS — Z1231 Encounter for screening mammogram for malignant neoplasm of breast: Secondary | ICD-10-CM

## 2021-01-17 ENCOUNTER — Other Ambulatory Visit (INDEPENDENT_AMBULATORY_CARE_PROVIDER_SITE_OTHER): Payer: Self-pay | Admitting: Nurse Practitioner

## 2021-01-22 ENCOUNTER — Other Ambulatory Visit (INDEPENDENT_AMBULATORY_CARE_PROVIDER_SITE_OTHER): Payer: Self-pay | Admitting: Nurse Practitioner

## 2021-01-22 MED ORDER — RIVAROXABAN 20 MG PO TABS: 20.0000 mg | ORAL_TABLET | Freq: Every day | ORAL | 11 refills | Status: DC

## 2021-01-29 ENCOUNTER — Ambulatory Visit (INDEPENDENT_AMBULATORY_CARE_PROVIDER_SITE_OTHER): Payer: 59 | Admitting: Vascular Surgery

## 2021-01-29 ENCOUNTER — Other Ambulatory Visit (INDEPENDENT_AMBULATORY_CARE_PROVIDER_SITE_OTHER): Payer: 59

## 2021-01-29 ENCOUNTER — Other Ambulatory Visit: Payer: Self-pay

## 2021-01-29 ENCOUNTER — Encounter (INDEPENDENT_AMBULATORY_CARE_PROVIDER_SITE_OTHER): Payer: Self-pay | Admitting: Vascular Surgery

## 2021-01-29 ENCOUNTER — Ambulatory Visit (INDEPENDENT_AMBULATORY_CARE_PROVIDER_SITE_OTHER): Payer: 59

## 2021-01-29 ENCOUNTER — Ambulatory Visit
Admission: RE | Admit: 2021-01-29 | Discharge: 2021-01-29 | Disposition: A | Discharge status: Home / Self Care | Payer: 59 | Source: Ambulatory Visit | Attending: Internal Medicine | Admitting: Internal Medicine

## 2021-01-29 VITALS — BP 137/75 | HR 79 | Resp 16 | Wt 257.8 lb

## 2021-01-29 DIAGNOSIS — Z95820 Peripheral vascular angioplasty status with implants and grafts: Secondary | ICD-10-CM

## 2021-01-29 DIAGNOSIS — I10 Essential (primary) hypertension: Secondary | ICD-10-CM | POA: Diagnosis not present

## 2021-01-29 DIAGNOSIS — I771 Stricture of artery: Secondary | ICD-10-CM

## 2021-01-29 DIAGNOSIS — Z1231 Encounter for screening mammogram for malignant neoplasm of breast: Secondary | ICD-10-CM | POA: Diagnosis present

## 2021-01-29 DIAGNOSIS — E1122 Type 2 diabetes mellitus with diabetic chronic kidney disease: Secondary | ICD-10-CM

## 2021-01-29 DIAGNOSIS — N183 Chronic kidney disease, stage 3 unspecified: Secondary | ICD-10-CM | POA: Diagnosis not present

## 2021-01-29 MED ORDER — CLOPIDOGREL BISULFATE 75 MG PO TABS: 75.0000 mg | ORAL_TABLET | Freq: Every day | ORAL | 6 refills | Status: DC

## 2021-01-29 NOTE — Assessment & Plan Note (Signed)
Alyssa Maynard duplex today shows monophasic flow throughout the left upper extremity consistent with recurrent stenosis or occlusion of Alyssa Maynard left subclavian artery stent.  Alyssa Maynard symptoms are less severe than at previous episodes.  We had a long discussion today regarding our options I told Alyssa Maynard at this point my thought process would be to treat Alyssa Maynard based largely on symptoms that she has had early recurrence from 2 percutaneous interventions in the past year.  I have discussed the reasons and rationale and the fact that she does not have any limb threatening symptoms at this time I have recommended she use that arm as much as possible to improve collateral flow.  She has found the newer anticoagulants to be cost prohibitive, and we are going to switch Alyssa Maynard to Plavix today.  I will plan to see Alyssa Maynard back in about 6 months with carotid duplex.

## 2021-01-29 NOTE — Progress Notes (Signed)
MRN : [id number]  Alyssa Maynard is a 60 y.o. (December 27, 1960) female who presents with chief complaint of  Chief Complaint  Patient presents with  . Follow-up    3 month ultrasound follow up  .  History of Present Illness: Patient returns today in follow up of her left subclavian artery disease.  She has undergone 2 percutaneous interventions within the past year or so for left arm pain.  After her most recent intervention, things definitely improved but about a month or 2 ago she started having more coolness and a little pain in the left hand.  Her arm does tire a little more easily than the right.  She denies any ulceration or infection.  Her duplex today shows monophasic flow throughout the left upper extremity consistent with recurrent stenosis or occlusion of her left subclavian artery stent.  Current Outpatient Medications  Medication Sig Dispense Refill  . aspirin 81 MG chewable tablet Chew 1 tablet (81 mg total) by mouth 2 (two) times daily. 60 tablet 0  . B-D ULTRAFINE III SHORT PEN 31G X 8 MM MISC Inject into the skin as directed.    . cholecalciferol (VITAMIN D) 1000 units tablet Take 2,000 Units by mouth daily.    . clopidogrel (PLAVIX) 75 MG tablet Take 1 tablet (75 mg total) by mouth daily. 30 tablet 6  . esomeprazole (NEXIUM) 40 MG capsule Take 40 mg by mouth daily.    [name] glimepiride (AMARYL) 4 MG tablet Take 4 mg by mouth daily.   0  . Insulin Pen Needle (PEN NEEDLES 31GX5/16") 31G X 8 MM MISC See admin instructions.    [name] lisinopril-hydrochlorothiazide (PRINZIDE,ZESTORETIC) 20-12.5 MG tablet Take 1 tablet by mouth daily.     . pioglitazone (ACTOS) 30 MG tablet Take 1 tablet by mouth daily.  1  . albuterol (VENTOLIN HFA) 108 (90 Base) MCG/ACT inhaler once daily as needed. Reported on 10/26/2015 (Patient not taking: No sig reported)    . B-D ULTRA-FINE 33 LANCETS MISC Use 1 Device once daily. (Patient not taking: No sig reported)    . cephALEXin (KEFLEX) 500 MG capsule Take 1  capsule (500 mg total) by mouth 4 (four) times daily. (Patient not taking: No sig reported) 40 capsule 0  . docusate sodium (COLACE) 100 MG capsule Take 1 capsule (100 mg total) by mouth 2 (two) times daily. (Patient not taking: No sig reported) 10 capsule 0  . escitalopram (LEXAPRO) 20 MG tablet Take 20 mg by mouth daily. (Patient not taking: No sig reported)    . glucose blood test strip TEST once daily (Patient not taking: No sig reported)    . oxyCODONE (OXYCONTIN) 10 mg 12 hr tablet Take 1 tablet (10 mg total) by mouth every 12 (twelve) hours. (Patient not taking: No sig reported) 10 tablet 0  . OZEMPIC, 0.25 OR 0.5 MG/DOSE, 2 MG/1.5ML SOPN SMARTSIG:0.25 Milligram(s) SUB-Q Once a Week (Patient not taking: No sig reported)    . rivaroxaban (XARELTO) 20 MG TABS tablet Take 1 tablet (20 mg total) by mouth daily with supper. (Patient not taking: Reported on 01/29/2021) 30 tablet 11  . rosuvastatin (CRESTOR) 5 MG tablet TAKE 1 TABLET(5 MG) BY MOUTH DAILY (Patient not taking: No sig reported) 30 tablet 3  . sitaGLIPtin (JANUVIA) 50 MG tablet Take 50 mg by mouth daily.     [name] sulfamethoxazole-trimethoprim (BACTRIM DS) 800-160 MG tablet Take 1 tablet by mouth 2 (two) times daily. (Patient not taking: No sig reported) 20 tablet  0  . tiZANidine (ZANAFLEX) 2 MG tablet 1-2 tablets at bedtime as needed for muscle spasm (Patient not taking: No sig reported)    . traMADol (ULTRAM) 50 MG tablet Take 50 mg by mouth.  (Patient not taking: No sig reported)    . TRESIBA FLEXTOUCH 100 UNIT/ML FlexTouch Pen SMARTSIG:25 Unit(s) SUB-Q Every Night (Patient not taking: No sig reported)     No current facility-administered medications for this visit.    Past Medical History:  Diagnosis Date  . Anxiety   . Chronic kidney disease 04/2017   stage 3  . Complication of anesthesia    pt experiences difficulty breathing after anesthesia due to sleep apnea and smoking  . Depression   . Diabetes mellitus without  complication (HCC)   . Dyspnea   . GERD (gastroesophageal reflux disease)   . Hypertension   . Renal insufficiency   . Sleep apnea     Past Surgical History:  Procedure Laterality Date  . APPENDECTOMY    . CARDIAC SURGERY    . CAROTID ANGIOGRAPHY N/A 04/27/2017   Procedure: Carotid Angiography;  Surgeon: Annice Needy, MD;  Location: [facility];  Service: Cardiovascular;  Laterality: N/A;  . CHOLECYSTECTOMY    . FOOT SURGERY Left   . HAND SURGERY Right   . HYSTEROSCOPY WITH D & C N/A 11/02/2015   Procedure: DILATATION AND CURETTAGE /HYSTEROSCOPY;  Surgeon: Ala Dach, MD;  Location: [facility];  Service: Gynecology;  Laterality: N/A;  . HYSTEROSCOPY WITH D & C    . LIVER SURGERY    . ORIF TIBIA PLATEAU Right 07/05/2018   Procedure: OPEN REDUCTION INTERNAL FIXATION (ORIF) TIBIAL PLATEAU;  Surgeon: Lyndle Herrlich, MD;  Location: [facility];  Service: Orthopedics;  Laterality: Right;  . TONSILLECTOMY    . UPPER EXTREMITY ANGIOGRAPHY Left 09/08/2019   Procedure: UPPER EXTREMITY ANGIOGRAPHY;  Surgeon: Annice Needy, MD;  Location: [facility];  Service: Cardiovascular;  Laterality: Left;  . UPPER EXTREMITY ANGIOGRAPHY N/A 09/17/2020   Procedure: UPPER EXTREMITY ANGIOGRAPHY;  Surgeon: Annice Needy, MD;  Location: [facility];  Service: Cardiovascular;  Laterality: N/A;  . UPPER GI ENDOSCOPY       Social History   Tobacco Use  . Smoking status: Current Every Day Smoker    Packs/day: 0.50  . Smokeless tobacco: Never Used  Vaping Use  . Vaping Use: Never used  Substance Use Topics  . Alcohol use: Not Currently  . Drug use: No      Family History  Problem Relation Age of Onset  . Heart attack Mother   . Stroke Mother   . Heart attack Father   . Cancer Father   . Breast cancer Neg Hx      Allergies  Allergen Reactions  . Latex Rash and Other (See Comments)    Burns skin   . Rosuvastatin Other (See Comments)  . Clopidogrel Rash    REVIEW  OF SYSTEMS(Negative unless checked)  Constitutional: [] ???Weight loss[] ???Fever[] ???Chills Cardiac:[] ???Chest pain[] ???Chest pressure[] ???Palpitations [] ???Shortness of breath when laying flat [] ???Shortness of breath at rest [] ???Shortness of breath with exertion. Vascular: [x] ???Pain in legs with walking[x] ???Pain in legsat rest[] ???Pain in legs when laying flat [] ???Claudication [] ???Pain in feet when walking [] ???Pain in feet at rest [] ???Pain in feet when laying flat [] ???History of DVT [] ???Phlebitis [] ???Swelling in legs [] ???Varicose veins [] ???Non-healing ulcers Pulmonary: [] ???Uses home oxygen [] ???Productive cough[] ???Hemoptysis [] ???Wheeze [] ???COPD [] ???Asthma Neurologic: [x] ???Dizziness [] ???Blackouts [] ???Seizures [] ???History of stroke [] ???History of TIA[] ???Aphasia [] ???Temporary blindness[] ???Dysphagia [  x]???Weaknessor numbness in arms [] ???Weakness or numbnessin legs Musculoskeletal: [x] ???Arthritis [] ???Joint swelling [] ???Joint pain [] ???Low back pain Hematologic:[] ???Easy bruising[] ???Easy bleeding [] ???Hypercoagulable state [] ???Anemic [] ???Hepatitis Gastrointestinal:[] ???Blood in stool[] ???Vomiting blood[x] ???Gastroesophageal reflux/heartburn[] ???Abdominal pain Genitourinary: [] ???Chronic kidney disease [] ???Difficulturination [] ???Frequenturination [] ???Burning with urination[] ???Hematuria Skin: [] ???Rashes [x] ???Ulcers [x] ???Wounds Psychological: [x] ???History of anxiety[x] ???History of major depression.   Physical Examination  BP 137/75 (BP Location: Right Arm)   Pulse 79   Resp 16   Wt 257 lb 12.8 oz (116.9 kg)   LMP 08/27/2015 (LMP Unknown) Comment: abnormal bleeding  BMI 42.90 kg/m  Gen:  WD/WN, NAD Head: [place]/AT, No temporalis wasting. Ear/Nose/Throat: Hearing grossly intact, nares w/o erythema or drainage Eyes: Conjunctiva clear. Sclera  non-icteric Neck: Supple.  Trachea midline Pulmonary:  Good air movement, no use of accessory muscles.  Cardiac: RRR, no JVD Vascular:  Vessel Right Left  Radial  2+ palpable  trace palpable           Musculoskeletal: M/S 5/5 throughout.  No deformity or atrophy.  No edema. Neurologic: Sensation grossly intact in extremities.  Symmetrical.  Speech is fluent.  Psychiatric: Judgment intact, Mood & affect appropriate for pt's clinical situation. Dermatologic: No rashes or ulcers noted.  No cellulitis or open wounds.       Labs No results found for this or any previous visit (from the past 2160 hour(s)).  Radiology No results found.  Assessment/Plan Diabetes (HCC) blood glucose control important in reducing the progression of atherosclerotic disease. Also, involved in wound healing. On appropriate medications.   Essential hypertension, benign blood pressure control important in reducing the progression of atherosclerotic disease. On appropriate oral medications.  CKD (chronic kidney disease) stage 3, GFR 30-59 ml/min We will hydrate and try to limit contrast with her any procedures requiring contrast.  Subclavian arterial stenosis (HCC) Her duplex today shows monophasic flow throughout the left upper extremity consistent with recurrent stenosis or occlusion of her left subclavian artery stent.  Her symptoms are less severe than at previous episodes.  We had a long discussion today regarding our options I told her at this point my thought process would be to treat her based largely on symptoms that she has had early recurrence from 2 percutaneous interventions in the past year.  I have discussed the reasons and rationale and the fact that she does not have any limb threatening symptoms at this time I have recommended she use that arm as much as possible to improve collateral flow.  She has found the newer anticoagulants to be cost prohibitive, and we are going to switch her to  Plavix today.  I will plan to see her back in about 6 months with carotid duplex.    , MD  01/29/2021 1:22 PM    This note was created with Dragon medical transcription system.  Any errors from dictation are purely unintentional

## 2021-04-23 ENCOUNTER — Other Ambulatory Visit: Payer: Self-pay | Admitting: Rheumatology

## 2021-04-23 DIAGNOSIS — I771 Stricture of artery: Secondary | ICD-10-CM

## 2021-04-23 DIAGNOSIS — R700 Elevated erythrocyte sedimentation rate: Secondary | ICD-10-CM

## 2021-04-30 ENCOUNTER — Ambulatory Visit
Admission: RE | Admit: 2021-04-30 | Discharge: 2021-04-30 | Disposition: A | Discharge status: Home / Self Care | Payer: 59 | Source: Ambulatory Visit | Attending: Rheumatology | Admitting: Rheumatology

## 2021-04-30 ENCOUNTER — Other Ambulatory Visit: Payer: Self-pay

## 2021-04-30 DIAGNOSIS — R918 Other nonspecific abnormal finding of lung field: Secondary | ICD-10-CM | POA: Diagnosis not present

## 2021-04-30 DIAGNOSIS — R700 Elevated erythrocyte sedimentation rate: Secondary | ICD-10-CM | POA: Insufficient documentation

## 2021-04-30 DIAGNOSIS — I708 Atherosclerosis of other arteries: Secondary | ICD-10-CM | POA: Diagnosis not present

## 2021-04-30 DIAGNOSIS — I700 Atherosclerosis of aorta: Secondary | ICD-10-CM | POA: Insufficient documentation

## 2021-04-30 DIAGNOSIS — I771 Stricture of artery: Secondary | ICD-10-CM | POA: Diagnosis present

## 2021-04-30 DIAGNOSIS — I2510 Atherosclerotic heart disease of native coronary artery without angina pectoris: Secondary | ICD-10-CM | POA: Insufficient documentation

## 2021-04-30 LAB — GLUCOSE, CAPILLARY: Glucose-Capillary: 86 mg/dL (ref 70–99)

## 2021-04-30 MED ORDER — FLUDEOXYGLUCOSE F - 18 (FDG) INJECTION
13.7000 | Freq: Once | INTRAVENOUS | Status: AC | PRN
  Administered 2021-04-30: 13.78 via INTRAVENOUS

## 2021-08-02 ENCOUNTER — Ambulatory Visit (INDEPENDENT_AMBULATORY_CARE_PROVIDER_SITE_OTHER): Payer: 59 | Admitting: Vascular Surgery

## 2021-08-02 ENCOUNTER — Encounter (INDEPENDENT_AMBULATORY_CARE_PROVIDER_SITE_OTHER): Payer: 59

## 2021-09-01 ENCOUNTER — Other Ambulatory Visit (INDEPENDENT_AMBULATORY_CARE_PROVIDER_SITE_OTHER): Payer: Self-pay | Admitting: Vascular Surgery

## 2022-04-15 ENCOUNTER — Other Ambulatory Visit (INDEPENDENT_AMBULATORY_CARE_PROVIDER_SITE_OTHER): Payer: Self-pay | Admitting: Vascular Surgery

## 2022-04-16 NOTE — Telephone Encounter (Signed)
Patient will need to contact the office and reschedule appointment to receive more refills

## 2022-05-11 ENCOUNTER — Emergency Department
Admission: EM | Admit: 2022-05-11 | Discharge: 2022-05-11 | Disposition: A | Discharge status: Home / Self Care | Payer: 59 | Attending: Emergency Medicine | Admitting: Emergency Medicine

## 2022-05-11 ENCOUNTER — Other Ambulatory Visit: Payer: Self-pay

## 2022-05-11 ENCOUNTER — Encounter: Payer: Self-pay | Admitting: Emergency Medicine

## 2022-05-11 DIAGNOSIS — R1031 Right lower quadrant pain: Secondary | ICD-10-CM | POA: Diagnosis present

## 2022-05-11 DIAGNOSIS — N390 Urinary tract infection, site not specified: Secondary | ICD-10-CM | POA: Insufficient documentation

## 2022-05-11 DIAGNOSIS — R112 Nausea with vomiting, unspecified: Secondary | ICD-10-CM | POA: Insufficient documentation

## 2022-05-11 LAB — COMPREHENSIVE METABOLIC PANEL
ALT: 17 U/L (ref 0–44)
AST: 18 U/L (ref 15–41)
Albumin: 4.1 g/dL (ref 3.5–5.0)
Alkaline Phosphatase: 113 U/L (ref 38–126)
Anion gap: 12 (ref 5–15)
BUN: 22 mg/dL (ref 8–23)
CO2: 23 mmol/L (ref 22–32)
Calcium: 9.5 mg/dL (ref 8.9–10.3)
Chloride: 101 mmol/L (ref 98–111)
Creatinine, Ser: 1.49 mg/dL — ABNORMAL HIGH (ref 0.44–1.00)
GFR, Estimated: 39 mL/min — ABNORMAL LOW (ref >60)
Glucose, Bld: 187 mg/dL — ABNORMAL HIGH (ref 70–99)
Potassium: 3.9 mmol/L (ref 3.5–5.1)
Sodium: 136 mmol/L (ref 135–145)
Total Bilirubin: 0.8 mg/dL (ref 0.3–1.2)
Total Protein: 7.5 g/dL (ref 6.5–8.1)

## 2022-05-11 LAB — CBC
HCT: 35.5 % — ABNORMAL LOW (ref 36.0–46.0)
Hemoglobin: 11 g/dL — ABNORMAL LOW (ref 12.0–15.0)
MCH: 26.1 pg (ref 26.0–34.0)
MCHC: 31 g/dL (ref 30.0–36.0)
MCV: 84.3 fL (ref 80.0–100.0)
Platelets: 397 10*3/uL (ref 150–400)
RBC: 4.21 MIL/uL (ref 3.87–5.11)
RDW: 15.5 % (ref 11.5–15.5)
WBC: 11.4 10*3/uL — ABNORMAL HIGH (ref 4.0–10.5)
nRBC: 0 % (ref 0.0–0.2)

## 2022-05-11 LAB — URINALYSIS, ROUTINE W REFLEX MICROSCOPIC
Bilirubin Urine: NEGATIVE
Glucose, UA: NEGATIVE mg/dL
Ketones, ur: NEGATIVE mg/dL
Nitrite: NEGATIVE
Protein, ur: 30 mg/dL — AB
Specific Gravity, Urine: 1.01 (ref 1.005–1.030)
pH: 5 (ref 5.0–8.0)

## 2022-05-11 LAB — LIPASE, BLOOD: Lipase: 34 U/L (ref 11–51)

## 2022-05-11 MED ORDER — CEFDINIR 300 MG PO CAPS
300.0000 mg | ORAL_CAPSULE | Freq: Two times a day (BID) | ORAL | 0 refills | Status: AC
Start: 2022-05-11 — End: 2022-05-21

## 2022-05-11 NOTE — Discharge Instructions (Addendum)
Please take antibiotic with at least 1 dose of a product containing active cultures each day (Florastor, active culture yogurt, kefir)

## 2022-05-11 NOTE — ED Notes (Signed)
E-signature not working at this time. Pt verbalized understanding of D/C instructions, prescriptions and follow up care with no further questions at this time. Pt in NAD and ambulatory at time of D/C.  

## 2022-05-11 NOTE — ED Triage Notes (Signed)
Pt reports pain to her right side back for a week that now radiates around to her RLQ. Pt reports has some urinary urgency and frequency but small amounts come out. Pt reports pain is constant and dull and uncomfortable. Pt states has one kidney

## 2022-05-11 NOTE — ED Provider Notes (Addendum)
[facility] Provider Note   Event Date/Time   First MD Initiated Contact with Patient 05/11/22 [phone]     (approximate) History  Abdominal Pain and Back Pain  HPI Alyssa Maynard is a 61 y.o. female  Location: Right flank and suprapubic region Duration: 24 hours prior to arrival Timing: Intermittent but stable since onset Severity: 8/10 Quality: Burning/sharp Context: Patient describes suprapubic, right lower quadrant, and right flank pain that began 24 hours prior to arrival Modifying factors: Denies Associated Symptoms: Dysuria, nausea/vomiting ROS: Patient currently denies any vision changes, tinnitus, difficulty speaking, facial droop, sore throat, chest pain, shortness of breath, diarrhea, or weakness/numbness/paresthesias in any extremity   Physical Exam  Triage Vital Signs: ED Triage Vitals  Enc Vitals Group     BP 05/11/22 0849 (!) 171/80     Pulse Rate 05/11/22 0849 88     Resp 05/11/22 0849 19     Temp 05/11/22 0849 98.2 F (36.8 C)     Temp Source 05/11/22 0849 Oral     SpO2 05/11/22 0849 95 %     Weight 05/11/22 0846 257 lb 15 oz (117 kg)     Height 05/11/22 0846 5\' 5"  (1.651 m)     Head Circumference --      Peak Flow --      Pain Score 05/11/22 0846 8     Pain Loc --      Pain Edu? --      Excl. in GC? --    Most recent vital signs: Vitals:   05/11/22 0849  BP: (!) 171/80  Pulse: 88  Resp: 19  Temp: 98.2 F (36.8 C)  SpO2: 95%   General: Awake, oriented x4. CV:  Good peripheral perfusion.  Resp:  Normal effort.  Abd:  No distention.  Other:  Middle-aged obese Caucasian female sitting in bed in no acute distress ED Results / Procedures / Treatments  Labs (all labs ordered are listed, but only abnormal results are displayed) Labs Reviewed  COMPREHENSIVE METABOLIC PANEL - Abnormal; Notable for the following components:      Result Value   Glucose, Bld 187 (*)    Creatinine, Ser 1.49 (*)    GFR, Estimated 39 (*)    All  other components within normal limits  CBC - Abnormal; Notable for the following components:   WBC 11.4 (*)    Hemoglobin 11.0 (*)    HCT 35.5 (*)    All other components within normal limits  URINALYSIS, ROUTINE W REFLEX MICROSCOPIC - Abnormal; Notable for the following components:   Color, Urine YELLOW (*)    APPearance CLOUDY (*)    Hgb urine dipstick MODERATE (*)    Protein, ur 30 (*)    Leukocytes,Ua MODERATE (*)    Bacteria, UA RARE (*)    Non Squamous Epithelial PRESENT (*)    All other components within normal limits  LIPASE, BLOOD   PROCEDURES: Critical Care performed: No .1-3 Lead EKG Interpretation  Performed by: 07/12/22, MD Authorized by: Merwyn Katos, MD     Interpretation: normal     ECG rate:  75   ECG rate assessment: normal     Rhythm: sinus rhythm     Ectopy: none     Conduction: normal    MEDICATIONS ORDERED IN ED: Medications - No data to display IMPRESSION / MDM / ASSESSMENT AND PLAN / ED COURSE  I reviewed the triage vital signs and the nursing notes.  The patient is on the cardiac monitor to evaluate for evidence of arrhythmia and/or significant heart rate changes. Patient's presentation is most consistent with acute presentation with potential threat to life or bodily function. Not Pregnant. Unlikely TOA, Ovarian Torsion, PID, gonorrhea/chlamydia. Low suspicion for Infected Urolithiasis, AAA, Cholecystitis, Pancreatitis, SBO, Appendicitis, or other acute abdomen.  Rx: Cefdinir 300 mg BID for 10 days Disposition: Discharge home. SRP discussed. Advise follow up with primary care provider within 24-72 hours.   FINAL CLINICAL IMPRESSION(S) / ED DIAGNOSES   Final diagnoses:  Urinary tract infection with hematuria, site unspecified   Rx / DC Orders   ED Discharge Orders          Ordered    cefdinir (OMNICEF) 300 MG capsule  2 times daily        05/11/22 1006           Note:  This document was  prepared using Dragon voice recognition software and may include unintentional dictation errors.   Merwyn Katos, MD 05/11/22 1018    Merwyn Katos, MD 05/11/22 1019

## 2022-05-19 ENCOUNTER — Other Ambulatory Visit (INDEPENDENT_AMBULATORY_CARE_PROVIDER_SITE_OTHER): Payer: Self-pay | Admitting: Nurse Practitioner

## 2022-05-19 NOTE — Telephone Encounter (Signed)
Patient will need to contact the office to reschedule appointment for carotid ultrasound and follow up with Dr Wyn Quaker or Vivia Birmingham to receive future refills

## 2022-05-27 ENCOUNTER — Other Ambulatory Visit (INDEPENDENT_AMBULATORY_CARE_PROVIDER_SITE_OTHER): Payer: Self-pay | Admitting: Nurse Practitioner

## 2022-06-02 ENCOUNTER — Other Ambulatory Visit: Payer: Self-pay | Admitting: Internal Medicine

## 2022-06-02 DIAGNOSIS — R9389 Abnormal findings on diagnostic imaging of other specified body structures: Secondary | ICD-10-CM

## 2022-06-06 ENCOUNTER — Ambulatory Visit
Admission: RE | Admit: 2022-06-06 | Discharge: 2022-06-06 | Disposition: A | Discharge status: Home / Self Care | Payer: 59 | Source: Ambulatory Visit | Attending: Internal Medicine | Admitting: Internal Medicine

## 2022-06-06 DIAGNOSIS — R9389 Abnormal findings on diagnostic imaging of other specified body structures: Secondary | ICD-10-CM

## 2022-06-27 ENCOUNTER — Other Ambulatory Visit (INDEPENDENT_AMBULATORY_CARE_PROVIDER_SITE_OTHER): Payer: Self-pay | Admitting: Nurse Practitioner

## 2022-06-27 NOTE — Telephone Encounter (Signed)
Patient will need to contact the office and reschedule missed appointment to continue with refills

## 2022-07-07 IMAGING — CT NM PET TUM IMG INITIAL (PI) SKULL BASE T - THIGH
1 of 9 series · 1 of 25 positions shown · non-contrast
Comparison: CT angio LEFT upper extremity 08/10/2019

CLINICAL DATA: Initial treatment strategy for vasculitis. Large
vessel vasculitis elevated inflammatory markers formal abnormal CT a
with LEFT subclavian artery stenosis. Elevated sed rate.

EXAM:
NUCLEAR MEDICINE PET SKULL BASE TO THIGH
TECHNIQUE: 13.8 mCi F-18 FDG was injected intravenously. Full-ring PET imaging
was performed from the skull base to thigh after the radiotracer. CT
data was obtained and used for attenuation correction and anatomic
localization.
Fasting blood glucose: 86 mg/dl

[Series 3: ct wb 5.0 b30f · axial · 5.0mm · 0.98mm/px · 1 of 329 slices shown]
[im 329/329  brain]
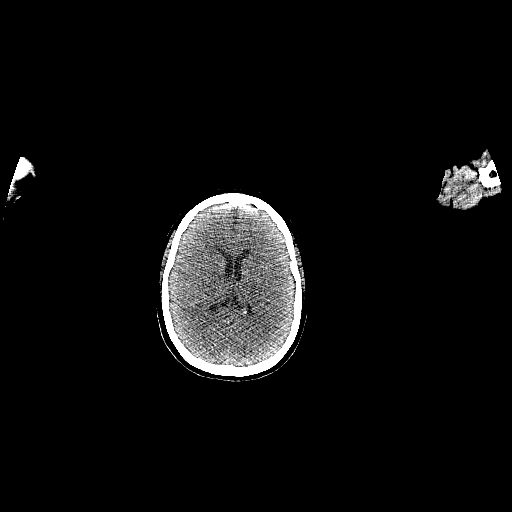

[1 of 25 positions shown; findings below may reference images not displayed]

FINDINGS: Mediastinal blood pool activity: SUV max

Liver activity: SUV max NA

NECK: No hypermetabolic lymph nodes in the neck.

Incidental CT findings: none

CHEST: There is no abnormal metabolic activity associated with the
ascending, transverse or descending aorta. No Abnormal radiotracer
activity associated with the great vessels. LEFT subclavian artery
stent noted.

No hypermetabolic mediastinal adenopathy.

Incidental CT findings: Small nodule in the RIGHT upper lobe
measures 3 mm on image 99). Coronary artery calcification and aortic
atherosclerotic calcification.

ABDOMEN/PELVIS: No hypermetabolic activity associated with the aorta
or branch vessels to suggest vasculitis. There is typical mild
activity associated with atherosclerotic calcifications in the
infrarenal abdominal aorta.

Incidental CT findings: Post partial hepatectomy.
Postcholecystectomy. Atherosclerotic calcification of the aorta.

SKELETON: No focal hypermetabolic activity to suggest skeletal
metastasis.

Incidental CT findings: none
IMPRESSION: 1. No abnormal metabolic activity associated with the large arterial
vessels of the chest or abdomen. No FDG evidence of vasculitis.
2. Small RIGHT upper lobe pulmonary nodule. No follow-up needed if
patient is low-risk. Non-contrast chest CT can be considered in 12
months if patient is high-risk. This recommendation follows the
consensus statement: Guidelines for Management of Incidental
Pulmonary Nodules Detected on CT Images: From the [HOSPITAL]
3. Coronary artery calcification and Aortic Atherosclerosis
([id number]-GYA.A).

## 2022-07-30 ENCOUNTER — Other Ambulatory Visit: Payer: Self-pay | Admitting: Internal Medicine

## 2022-07-30 DIAGNOSIS — R1032 Left lower quadrant pain: Secondary | ICD-10-CM

## 2022-08-07 ENCOUNTER — Ambulatory Visit
Admission: RE | Admit: 2022-08-07 | Discharge: 2022-08-07 | Disposition: A | Discharge status: Home / Self Care | Payer: 59 | Source: Ambulatory Visit | Attending: Internal Medicine | Admitting: Internal Medicine

## 2022-08-07 DIAGNOSIS — R1032 Left lower quadrant pain: Secondary | ICD-10-CM

## 2022-08-07 MED ORDER — IOPAMIDOL (ISOVUE-300) INJECTION 61%
80.0000 mL | Freq: Once | INTRAVENOUS | Status: AC | PRN
  Administered 2022-08-07: 80 mL via INTRAVENOUS

## 2022-09-01 ENCOUNTER — Encounter (INDEPENDENT_AMBULATORY_CARE_PROVIDER_SITE_OTHER): Payer: Self-pay

## 2022-09-05 ENCOUNTER — Other Ambulatory Visit: Payer: Self-pay | Admitting: Internal Medicine

## 2022-09-05 DIAGNOSIS — I2510 Atherosclerotic heart disease of native coronary artery without angina pectoris: Secondary | ICD-10-CM

## 2022-09-05 DIAGNOSIS — R0609 Other forms of dyspnea: Secondary | ICD-10-CM

## 2022-09-05 DIAGNOSIS — I10 Essential (primary) hypertension: Secondary | ICD-10-CM

## 2022-09-19 ENCOUNTER — Ambulatory Visit
Admission: RE | Admit: 2022-09-19 | Discharge: 2022-09-19 | Disposition: A | Discharge status: Home / Self Care | Payer: 59 | Source: Ambulatory Visit | Attending: Internal Medicine | Admitting: Internal Medicine

## 2022-09-19 DIAGNOSIS — I10 Essential (primary) hypertension: Secondary | ICD-10-CM | POA: Insufficient documentation

## 2022-09-19 DIAGNOSIS — I2510 Atherosclerotic heart disease of native coronary artery without angina pectoris: Secondary | ICD-10-CM | POA: Diagnosis present

## 2022-09-19 DIAGNOSIS — R0609 Other forms of dyspnea: Secondary | ICD-10-CM | POA: Diagnosis present

## 2022-09-19 MED ORDER — REGADENOSON 0.4 MG/5ML IV SOLN
0.4000 mg | Freq: Once | INTRAVENOUS | Status: AC
  Administered 2022-09-19: 0.4 mg via INTRAVENOUS

## 2022-09-19 MED ORDER — TECHNETIUM TC 99M TETROFOSMIN IV KIT
31.7400 | PACK | Freq: Once | INTRAVENOUS | Status: AC | PRN
  Administered 2022-09-19: 31.74 via INTRAVENOUS

## 2022-09-19 MED ORDER — TECHNETIUM TC 99M TETROFOSMIN IV KIT
11.0900 | PACK | Freq: Once | INTRAVENOUS | Status: AC | PRN
  Administered 2022-09-19: 11.09 via INTRAVENOUS

## 2022-09-23 NOTE — CV Procedure (Signed)
Brief Myoview report  Outpatient Myoview for chest pain and anginal Referring physician Dr. Daniel Nones. Date of procedure 09/19/2022 outpatient  EKG nondiagnostic Adequate pharmacological procedure  Nondiagnostic EKG  Images Normal uptake and washout of Myoview no clear defects or evidence of redistribution motion normal LV size and function ejection fraction of 55%  Conclusion Normal Myoview ,no evidence of ischemia this is a low risk scan

## 2022-09-30 LAB — NM MYOCAR MULTI W/SPECT W/WALL MOTION / EF
Estimated workload: 1
Exercise duration (min): 1 min
LV dias vol: 65 mL (ref 46–106)
LV sys vol: 29 mL
MPHR: 157 {beats}/min
Nuc Stress EF: 55 %
Peak HR: 94 {beats}/min
Percent HR: 59 %
Rest HR: 76 {beats}/min
Rest Nuclear Isotope Dose: 11.1 mCi
SDS: 2
SRS: 4
SSS: 2
Stress Nuclear Isotope Dose: 31.7 mCi
TID: 0.79

## 2022-11-17 ENCOUNTER — Other Ambulatory Visit: Payer: Self-pay | Admitting: Internal Medicine

## 2022-11-17 DIAGNOSIS — R935 Abnormal findings on diagnostic imaging of other abdominal regions, including retroperitoneum: Secondary | ICD-10-CM

## 2022-11-17 DIAGNOSIS — R1012 Left upper quadrant pain: Secondary | ICD-10-CM

## 2022-12-12 ENCOUNTER — Ambulatory Visit
Admission: RE | Admit: 2022-12-12 | Discharge: 2022-12-12 | Disposition: A | Discharge status: Home / Self Care | Payer: 59 | Source: Ambulatory Visit | Attending: Internal Medicine | Admitting: Internal Medicine

## 2022-12-12 ENCOUNTER — Ambulatory Visit: Payer: 59

## 2022-12-12 DIAGNOSIS — R1012 Left upper quadrant pain: Secondary | ICD-10-CM | POA: Diagnosis present

## 2022-12-12 DIAGNOSIS — R935 Abnormal findings on diagnostic imaging of other abdominal regions, including retroperitoneum: Secondary | ICD-10-CM | POA: Diagnosis not present

## 2022-12-12 MED ORDER — IOHEXOL 300 MG/ML  SOLN
75.0000 mL | Freq: Once | INTRAMUSCULAR | Status: AC | PRN
  Administered 2022-12-12: 75 mL via INTRAVENOUS

## 2022-12-17 ENCOUNTER — Encounter: Payer: Self-pay | Admitting: Cardiovascular Disease

## 2022-12-17 ENCOUNTER — Ambulatory Visit: Payer: 59 | Attending: Cardiovascular Disease | Admitting: Cardiovascular Disease

## 2022-12-17 VITALS — BP 132/64 | HR 87 | Ht 64.0 in | Wt 299.5 lb

## 2022-12-17 DIAGNOSIS — R0602 Shortness of breath: Secondary | ICD-10-CM | POA: Diagnosis not present

## 2022-12-17 DIAGNOSIS — E785 Hyperlipidemia, unspecified: Secondary | ICD-10-CM | POA: Diagnosis not present

## 2022-12-17 DIAGNOSIS — I771 Stricture of artery: Secondary | ICD-10-CM

## 2022-12-17 DIAGNOSIS — I10 Essential (primary) hypertension: Secondary | ICD-10-CM

## 2022-12-17 LAB — POCT I-STAT CREATININE: Creatinine, Ser: 1.7 mg/dL — ABNORMAL HIGH (ref 0.44–1.00)

## 2022-12-17 NOTE — Patient Instructions (Signed)
Medication Instructions:  No changes *If you need a refill on your cardiac medications before your next appointment, please call your pharmacy*   Lab Work: None ordered If you have labs (blood work) drawn today and your tests are completely normal, you will receive your results only by: [place] (if you have MyChart) OR A paper copy in the mail If you have any lab test that is abnormal or we need to change your treatment, we will call you to review the results.   Testing/Procedures: Your physician has requested that you have an echocardiogram. Echocardiography is a painless test that uses sound waves to create images of your heart. It provides your doctor with information about the size and shape of your heart and how well your heart's chambers and valves are working.   You may receive an ultrasound enhancing agent through an IV if needed to better visualize your heart during the echo. This procedure takes approximately one hour.  There are no restrictions for this procedure.  This will take place at [facility] [id number], [place]    Follow-Up: At [facility], you and your health needs are our priority.  As part of our continuing mission to provide you with exceptional heart care, we have created designated Provider Care Teams.  These Care Teams include your primary Cardiologist (physician) and Advanced Practice Providers (APPs -  Physician Assistants and Nurse Practitioners) who all work together to provide you with the care you need, when you need it.  We recommend signing up for the patient portal called "MyChart".  Sign up information is provided on this After Visit Summary.  MyChart is used to connect with patients for Virtual Visits (Telemedicine).  Patients are able to view lab/test results, encounter notes, upcoming appointments, etc.  Non-urgent messages can be sent to your provider as well.   To learn more about what you can  do with MyChart, go to [email or web address].    Your next appointment:   2 month(s)  Provider:   You may see Dr. Fletcher Anon or one of the following Advanced Practice Providers on your designated Care Team:   Murray Hodgkins, NP Christell Faith, PA-C Cadence Kathlen Mody, PA-C Gerrie Nordmann, NP    Other Instructions Please bring your medications to your next appointment.

## 2022-12-17 NOTE — Progress Notes (Signed)
Cardiology Office Note   Date:  12/17/2022   ID:  Alyssa Maynard, DOB 1961/02/21, MRN [id number]  PCP:  Adin Hector, MD  Cardiologist:   Kathlyn Sacramento, MD   Chief Complaint  Patient presents with   New Patient (Initial Visit)    Ref by Dr. Ramonita Lab for shortness of breath on exertion. Patient c/o spells of feeling like she could black out, she will feel flushed in her face and chest. Medications reviewed by the patient verbally.       History of Present Illness: Alyssa Maynard is a 62 y.o. female who was referred by Dr. Caryl Comes for evaluation of exertional dyspnea.  She has known history of left subclavian stenosis status post stent placement in 2021, morbid obesity, essential hypertension, diabetes mellitus, sleep apnea, GERD, chronic kidney disease/absence of 1 kidney, anemia of chronic disease and hyperlipidemia.  She underwent a treadmill Myoview in November 2023 which showed no significant perfusion defects.  However, she had significant shortness of breath with Lexiscan.  It was mentioned that she had blunted blood pressure response to exercise but the test was chemical with Lexiscan and according to documentation her blood pressure remained in the normal range. She reports no chest pain but complains of dyspnea with minimal activities which has been progressive to the point of happening after walking few steps.  In addition, she is struggled with weight gain.  She had multiple episodes of dizziness, flushing and presyncope in the last few months.  No palpitations or tachycardia.   Past Medical History:  Diagnosis Date   Anxiety    Chronic kidney disease 04/2017   stage 3   Complication of anesthesia    pt experiences difficulty breathing after anesthesia due to sleep apnea and smoking   Depression    Diabetes mellitus without complication (HCC)    Dyspnea    GERD (gastroesophageal reflux disease)    Hypertension    Renal insufficiency    Sleep apnea     Past  Surgical History:  Procedure Laterality Date   APPENDECTOMY     CARDIAC SURGERY     CAROTID ANGIOGRAPHY N/A 04/27/2017   Procedure: Carotid Angiography;  Surgeon: Algernon Huxley, MD;  Location: [facility];  Service: Cardiovascular;  Laterality: N/A;   CHOLECYSTECTOMY     FOOT SURGERY Left    HAND SURGERY Right    HYSTEROSCOPY WITH D & C N/A 11/02/2015   Procedure: DILATATION AND CURETTAGE /HYSTEROSCOPY;  Surgeon: Lorette Ang, MD;  Location: [facility];  Service: Gynecology;  Laterality: N/A;   HYSTEROSCOPY WITH D & C     LIVER SURGERY     ORIF TIBIA PLATEAU Right 07/05/2018   Procedure: OPEN REDUCTION INTERNAL FIXATION (ORIF) TIBIAL PLATEAU;  Surgeon: Lovell Sheehan, MD;  Location: [facility];  Service: Orthopedics;  Laterality: Right;   TONSILLECTOMY     UPPER EXTREMITY ANGIOGRAPHY Left 09/08/2019   Procedure: UPPER EXTREMITY ANGIOGRAPHY;  Surgeon: Algernon Huxley, MD;  Location: [place] [facility];  Service: Cardiovascular;  Laterality: Left;   UPPER EXTREMITY ANGIOGRAPHY N/A 09/17/2020   Procedure: UPPER EXTREMITY ANGIOGRAPHY;  Surgeon: Algernon Huxley, MD;  Location: [place] [facility];  Service: Cardiovascular;  Laterality: N/A;   UPPER GI ENDOSCOPY       Current Outpatient Medications  Medication Sig Dispense Refill   allopurinol (ZYLOPRIM) 300 MG tablet Take 300 mg by mouth daily.     aspirin 81 MG chewable tablet  Chew 1 tablet (81 mg total) by mouth 2 (two) times daily. 60 tablet 0   cholecalciferol (VITAMIN D) 1000 units tablet Take 2,000 Units by mouth daily.     clopidogrel (PLAVIX) 75 MG tablet TAKE 1 TABLET(75 MG) BY MOUTH DAILY 30 tablet 0   docusate sodium (COLACE) 100 MG capsule Take 1 capsule (100 mg total) by mouth 2 (two) times daily. 10 capsule 0   esomeprazole (NEXIUM) 40 MG capsule Take 40 mg by mouth daily.     glimepiride (AMARYL) 4 MG tablet Take 4 mg by mouth daily.   0   losartan-hydrochlorothiazide (HYZAAR) 50-12.5 MG tablet Take 1 tablet by mouth  daily.     MOUNJARO 5 MG/0.5ML Pen Inject into the skin.     albuterol (VENTOLIN HFA) 108 (90 Base) MCG/ACT inhaler once daily as needed. Reported on 10/26/2015 (Patient not taking: No sig reported)     B-D ULTRA-FINE 33 LANCETS MISC Use 1 Device once daily. (Patient not taking: No sig reported)     B-D ULTRAFINE III SHORT PEN 31G X 8 MM MISC Inject into the skin as directed. (Patient not taking: Reported on 12/17/2022)     cephALEXin (KEFLEX) 500 MG capsule Take 1 capsule (500 mg total) by mouth 4 (four) times daily. (Patient not taking: Reported on 08/30/2019) 40 capsule 0   escitalopram (LEXAPRO) 20 MG tablet Take 20 mg by mouth daily. (Patient not taking: Reported on 09/07/2020)     glucose blood test strip TEST once daily (Patient not taking: No sig reported)     oxyCODONE (OXYCONTIN) 10 mg 12 hr tablet Take 1 tablet (10 mg total) by mouth every 12 (twelve) hours. (Patient not taking: Reported on 08/30/2019) 10 tablet 0   OZEMPIC, 0.25 OR 0.5 MG/DOSE, 2 MG/1.5ML SOPN SMARTSIG:0.25 Milligram(s) SUB-Q Once a Week (Patient not taking: No sig reported)     pioglitazone (ACTOS) 30 MG tablet Take 1 tablet by mouth daily. (Patient not taking: Reported on 12/17/2022)  1   rivaroxaban (XARELTO) 20 MG TABS tablet Take 1 tablet (20 mg total) by mouth daily with supper. (Patient not taking: Reported on 01/29/2021) 30 tablet 11   rosuvastatin (CRESTOR) 5 MG tablet TAKE 1 TABLET(5 MG) BY MOUTH DAILY (Patient not taking: No sig reported) 30 tablet 3   sitaGLIPtin (JANUVIA) 50 MG tablet Take 50 mg by mouth daily.  (Patient not taking: Reported on 12/17/2022)     sulfamethoxazole-trimethoprim (BACTRIM DS) 800-160 MG tablet Take 1 tablet by mouth 2 (two) times daily. (Patient not taking: Reported on 08/30/2019) 20 tablet 0   tiZANidine (ZANAFLEX) 2 MG tablet 1-2 tablets at bedtime as needed for muscle spasm (Patient not taking: No sig reported)     traMADol (ULTRAM) 50 MG tablet Take 50 mg by mouth.  (Patient not  taking: Reported on 09/07/2020)     TRESIBA FLEXTOUCH 100 UNIT/ML FlexTouch Pen SMARTSIG:25 Unit(s) SUB-Q Every Night (Patient not taking: No sig reported)     No current facility-administered medications for this visit.    Allergies:   Pravastatin, Latex, Levofloxacin, Rosuvastatin, and Clopidogrel    Social History:  The patient  reports that she quit smoking about 13 months ago. Her smoking use included cigarettes. She has a 15.00 pack-year smoking history. She has never used smokeless tobacco. She reports that she does not currently use alcohol. She reports that she does not use drugs.   Family History:  The patient's family history includes Cancer in her father; Heart attack in her  father and mother; Stroke in her mother.    ROS:  Please see the history of present illness.   Otherwise, review of systems are positive for none.   All other systems are reviewed and negative.    PHYSICAL EXAM: VS:  BP 132/64 (BP Location: Right Wrist, Patient Position: Sitting, Cuff Size: Normal)   Pulse 87   Ht 5' 4"$  (1.626 m)   Wt 299 lb 8 oz (135.9 kg)   LMP 08/27/2015 (LMP Unknown) Comment: abnormal bleeding  SpO2 98%   BMI 51.41 kg/m  , BMI Body mass index is 51.41 kg/m. GEN: Well nourished, well developed, in no acute distress  HEENT: normal  Neck: no JVD, carotid bruits, or masses Cardiac: RRR; no murmurs, rubs, or gallops,no edema  Respiratory:  clear to auscultation bilaterally, normal work of breathing GI: soft, nontender, nondistended, + BS MS: no deformity or atrophy  Skin: warm and dry, no rash Neuro:  Strength and sensation are intact Psych: euthymic mood, full affect   EKG:  EKG is ordered today. The ekg ordered today demonstrates normal sinus rhythm with low voltage.   Recent Labs: 05/11/2022: ALT 17; BUN 22; Hemoglobin 11.0; Platelets 397; Potassium 3.9; Sodium 136 12/12/2022: Creatinine, Ser 1.70    Lipid Panel No results found for: "CHOL", "TRIG", "HDL", "CHOLHDL",  "VLDL", "LDLCALC", "LDLDIRECT"    Wt Readings from Last 3 Encounters:  12/17/22 299 lb 8 oz (135.9 kg)  05/11/22 257 lb 15 oz (117 kg)  01/29/21 257 lb 12.8 oz (116.9 kg)         12/17/2022    2:52 PM  PAD Screen  Previous PAD dx? No  Previous surgical procedure? No  Pain with walking? No  Feet/toe relief with dangling? No  Painful, non-healing ulcers? No  Extremities discolored? No      ASSESSMENT AND PLAN:  1.  Severe exertional dyspnea: Will obtain an echocardiogram to evaluate LV systolic and diastolic function.  It is possible that dyspnea is anginal equivalent.  In spite of normal nuclear stress test in November, this does not exclude the possibility of obstructive coronary artery disease.  At the same time, there is likely a degree of physical deconditioning especially with continued weight gain. I asked her to bring her medications to her next follow-up appointment.  Consider empiric antianginal therapy and if symptoms persist, will consider cardiac catheterization but will have to keep in mind at increased risk of contrast-induced nephropathy and vascular access complications related to morbid obesity.  2.  Status post left subclavian artery stent placement: Left arm pulses are not palpable and most recent Doppler showed monophasic waveforms in the left arm which is highly suggestive of an occluded stent in the left subclavian artery.  She does not have flow-limiting claudication at the present time and thus I do not recommend revascularization of this.  Her blood pressure should always be checked in the right arm.  3.  Essential hypertension: Blood pressure is controlled.  4.  Hyperlipidemia: Not on statins due to myalgia.  Will consider alternative to statins upon follow-up.  5.  Diabetes mellitus with chronic kidney disease: Most recent creatinine was 1.7.    Disposition: Obtain an echo cardiogram and follow-up in 2 months.  Signed,  Kathlyn Sacramento, MD   12/17/2022 3:32 PM    [facility]

## 2023-01-22 ENCOUNTER — Ambulatory Visit: Payer: 59 | Admitting: Cardiovascular Disease

## 2023-01-27 ENCOUNTER — Other Ambulatory Visit: Payer: Self-pay

## 2023-02-03 ENCOUNTER — Ambulatory Visit: Payer: 59 | Attending: Cardiovascular Disease

## 2023-02-03 DIAGNOSIS — R0602 Shortness of breath: Secondary | ICD-10-CM

## 2023-02-03 LAB — ECHOCARDIOGRAM COMPLETE
AR max vel: 2.59 cm2
AV Area VTI: 2.84 cm2
AV Area mean vel: 2.57 cm2
AV Mean grad: 8 mmHg
AV Peak grad: 14.4 mmHg
Ao pk vel: 1.9 m/s
Area-P 1/2: 3.59 cm2
Calc EF: 63.1 %
S' Lateral: 3 cm
Single Plane A2C EF: 63.6 %
Single Plane A4C EF: 62.8 %

## 2023-02-17 ENCOUNTER — Ambulatory Visit: Payer: 59 | Attending: Physician Assistant | Admitting: Physician Assistant

## 2023-02-17 ENCOUNTER — Ambulatory Visit (INDEPENDENT_AMBULATORY_CARE_PROVIDER_SITE_OTHER): Payer: 59

## 2023-02-17 ENCOUNTER — Ambulatory Visit
Admission: RE | Admit: 2023-02-17 | Discharge: 2023-02-17 | Disposition: A | Discharge status: Home / Self Care | Payer: 59 | Source: Ambulatory Visit | Attending: Physician Assistant | Admitting: Physician Assistant

## 2023-02-17 ENCOUNTER — Encounter: Payer: Self-pay | Admitting: Physician Assistant

## 2023-02-17 VITALS — BP 137/77 | HR 86 | Ht 65.0 in | Wt 298.2 lb

## 2023-02-17 DIAGNOSIS — I10 Essential (primary) hypertension: Secondary | ICD-10-CM

## 2023-02-17 DIAGNOSIS — R002 Palpitations: Secondary | ICD-10-CM | POA: Diagnosis not present

## 2023-02-17 DIAGNOSIS — R519 Headache, unspecified: Secondary | ICD-10-CM | POA: Insufficient documentation

## 2023-02-17 DIAGNOSIS — I771 Stricture of artery: Secondary | ICD-10-CM | POA: Diagnosis not present

## 2023-02-17 DIAGNOSIS — W01119A Fall on same level from slipping, tripping and stumbling with subsequent striking against unspecified sharp object, initial encounter: Secondary | ICD-10-CM | POA: Diagnosis not present

## 2023-02-17 DIAGNOSIS — R0609 Other forms of dyspnea: Secondary | ICD-10-CM | POA: Diagnosis not present

## 2023-02-17 DIAGNOSIS — N289 Disorder of kidney and ureter, unspecified: Secondary | ICD-10-CM

## 2023-02-17 DIAGNOSIS — E785 Hyperlipidemia, unspecified: Secondary | ICD-10-CM

## 2023-02-17 DIAGNOSIS — Z789 Other specified health status: Secondary | ICD-10-CM

## 2023-02-17 NOTE — Progress Notes (Signed)
Cardiology Office Note    Date:  02/17/2023   ID:  Alyssa Maynard, DOB 05/19/1961, MRN [id number]  PCP:  Lynnea Ferrier, MD  Cardiologist:  Lorine Bears, MD  Electrophysiologist:  None   Chief Complaint: Follow-up  History of Present Illness:   Alyssa Maynard is a 62 y.o. 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  She was previously followed by Dr. Lady Gary with [facility] cardiology.  48-hour Holter in 2018 showed a predominant rhythm of sinus with occasional multiform PVCs and PACs with no prolonged pauses or sustained arrhythmias.  Echo in 2018 showed an EF greater than 55% with mild LVH, normal RV systolic function, and trivial mitral regurgitation.  Lexiscan MPI in 2020 showed no evidence of ischemia or infarction with a normal EF.  She established with Dr. Kirke Corin in 12/2022 for evaluation of shortness of breath and near syncope.  She had recently undergone a Lexiscan MPI and 09/2022, ordered by PCP, which showed no significant perfusion defects.  Upon establishing with our office, she continued to note dyspnea with minimal activities that had been progressive to the point of happening after walking a few steps.  She was without frank chest pain.  She reported weight gain as well as multiple episodes of dizziness, flushing, and presyncope.  No tachypalpitations.  Echo in 02/2023 showed an EF of 60 to 65%, no regional wall motion abnormalities, borderline dilatation of the LV internal cavity, normal diastolic function parameters, normal RV systolic function with mildly enlarged ventricular cavity size, mildly dilated right atrium, and trivial mitral regurgitation.  She comes in reporting a mechanical fall on 01/21/2023.  She got up around 4 or 4:30 in the morning, got tangled up in her pillows and fell  out of the bed.  She struck the right side of her head/face on her nightstand.  She did not suffer LOC.  She did not seek medical attention at that time.  She dealt with a headache for approximately 9 days afterwards, which has subsequently improved.  She does continue to note some underlying fatigue, though this was present before hand.  No visual field deficit.  She also continues to note intermittent palpitations that will occur a couple of times per month and will typically last for 20 minutes.  She has been without frank angina.  She also notes some lower extremity/ankle swelling that is more progressive throughout the day and improved early in the morning.  She prefers to avoid medications where possible and would like to defer invasive testing with contrast given her unilateral kidney status.   Labs independently reviewed: 12/2022 - Hgb 10.0, PLT unable to report due to clumps 11/2022 - A1c 6.7, potassium 5.0, BUN 33, serum creatinine 1.6, albumin 4.0, AST/ALT normal, PLT 377, TSH normal, TC 179, TG 121, HDL 43, LDL 111  Past Medical History:  Diagnosis Date   Anxiety    Chronic kidney disease 04/2017   stage 3   Complication of anesthesia    pt experiences difficulty breathing after anesthesia due to sleep apnea and smoking   Depression    Diabetes mellitus without complication    Dyspnea    GERD (gastroesophageal reflux disease)    Hypertension    Renal insufficiency    Sleep apnea     Past Surgical History:  Procedure Laterality Date   APPENDECTOMY     CARDIAC SURGERY     CAROTID ANGIOGRAPHY N/A 04/27/2017   Procedure: Carotid Angiography;  Surgeon: Annice Needy, MD;  Location: [facility];  Service: Cardiovascular;  Laterality: N/A;   CHOLECYSTECTOMY     FOOT SURGERY Left    HAND SURGERY Right    HYSTEROSCOPY WITH D & C N/A 11/02/2015   Procedure: DILATATION AND CURETTAGE /HYSTEROSCOPY;  Surgeon: Ala Dach, MD;  Location: [facility] ORS;  Service: Gynecology;   Laterality: N/A;   HYSTEROSCOPY WITH D & C     LIVER SURGERY     ORIF TIBIA PLATEAU Right 07/05/2018   Procedure: OPEN REDUCTION INTERNAL FIXATION (ORIF) TIBIAL PLATEAU;  Surgeon: Lyndle Herrlich, MD;  Location: [facility] ORS;  Service: Orthopedics;  Laterality: Right;   TONSILLECTOMY     UPPER EXTREMITY ANGIOGRAPHY Left 09/08/2019   Procedure: UPPER EXTREMITY ANGIOGRAPHY;  Surgeon: Annice Needy, MD;  Location: [facility];  Service: Cardiovascular;  Laterality: Left;   UPPER EXTREMITY ANGIOGRAPHY N/A 09/17/2020   Procedure: UPPER EXTREMITY ANGIOGRAPHY;  Surgeon: Annice Needy, MD;  Location: [facility];  Service: Cardiovascular;  Laterality: N/A;   UPPER GI ENDOSCOPY      Current Medications: Current Meds  Medication Sig   allopurinol (ZYLOPRIM) 300 MG tablet Take 300 mg by mouth daily.   aspirin 81 MG chewable tablet Chew 1 tablet (81 mg total) by mouth 2 (two) times daily.   cholecalciferol (VITAMIN D) 1000 units tablet Take 8,000 Units by mouth daily.   clopidogrel (PLAVIX) 75 MG tablet TAKE 1 TABLET(75 MG) BY MOUTH DAILY   esomeprazole (NEXIUM) 40 MG capsule Take 40 mg by mouth daily.   glimepiride (AMARYL) 4 MG tablet Take 4 mg by mouth daily.    hydroxychloroquine (PLAQUENIL) 200 MG tablet Take 1 tablet by mouth 2 (two) times daily.   losartan-hydrochlorothiazide (HYZAAR) 50-12.5 MG tablet Take 1 tablet by mouth daily.   MOUNJARO 5 MG/0.5ML Pen Inject 7.5 mg into the skin once a week.   pioglitazone (ACTOS) 30 MG tablet Take 1 tablet by mouth daily.   TRESIBA FLEXTOUCH 100 UNIT/ML FlexTouch Pen SMARTSIG:25 Unit(s) SUB-Q Every Night    Allergies:   Pravastatin, Latex, Levofloxacin, Rosuvastatin, and Clopidogrel   Social History   Socioeconomic History   Marital status: Married    Spouse name: Not on file   Number of children: Not on file   Years of education: Not on file   Highest education level: Not on file  Occupational History   Not on file  Tobacco Use    Smoking status: Former    Packs/day: 0.50    Years: 30.00    Additional pack years: 0.00    Total pack years: 15.00    Types: Cigarettes    Quit date: 11/04/2021    Years since quitting: 1.2   Smokeless tobacco: Never  Vaping Use   Vaping Use: Never used  Substance and Sexual Activity   Alcohol use: Not Currently   Drug use: No   Sexual activity: Not on file  Other Topics Concern   Not on file  Social History Narrative   Not on file   Social Determinants of Health   Financial Resource Strain: Not on file  Food Insecurity: Not on file  Transportation Needs: Not on file  Physical Activity: Not on file  Stress: Not on file  Social Connections: Not on file  Family History:  The patient's family history includes Cancer in her father; Heart attack in her father and mother; Stroke in her mother. There is no history of Breast cancer.  ROS:   12-point review of systems is negative unless otherwise noted in the HPI.   EKGs/Labs/Other Studies Reviewed:    Studies reviewed were summarized above. The additional studies were reviewed today:  2D echo 02/03/2023: 1. Left ventricular ejection fraction, by estimation, is 60 to 65%. The  left ventricle has normal function. The left ventricle has no regional  wall motion abnormalities. The left ventricular internal cavity size was  borderline dilated. Left ventricular  diastolic parameters were normal. The average left ventricular global  longitudinal strain is -21.4 %. The global longitudinal strain is normal.   2. Right ventricular systolic function is normal. The right ventricular  size is mildly enlarged. Mildly increased right ventricular wall  thickness. Tricuspid regurgitation signal is inadequate for assessing PA  pressure.   3. Right atrial size was mildly dilated.   4. The mitral valve is grossly normal. Trivial mitral valve  regurgitation. No evidence of mitral stenosis.   5. The aortic valve was not well visualized.  Aortic valve regurgitation  is not visualized. No aortic stenosis is present.  __________  Eugenie Birks MPI 09/19/2022:   The study is normal. The study is low risk.   Left ventricular function is normal. End diastolic cavity size is normal.   Normal myocardial perfusion scan no evidence of stress-induced myocardial ischemia with chemical protocol normal left ventricular function 55%.  This is a low risk study. __________  Eugenie Birks MPI 08/31/2019 Gavin Potters): Summary of LV Function: Normal    TID Ratio: 1.05   LVEF= 77%   FINDINGS:  Regional wall motion:  reveals normal myocardial thickening and wall  motion.  The overall quality of the study is good.   Artifacts noted: no  Left ventricular cavity: normal.   Perfusion Analysis:  SPECT images demonstrate homogeneous tracer  distribution throughout the myocardium. Defect type : Normal  __________  2D echo 03/13/2017 Gavin Potters): NTERPRETATION  NORMAL LEFT VENTRICULAR SYSTOLIC FUNCTION   WITH MILD LVH  NORMAL RIGHT VENTRICULAR SYSTOLIC FUNCTION  TRIVIAL REGURGITATION NOTED (See above)  NO VALVULAR STENOSIS  EF >55%  Closest EF: >55% (Estimated)  LVH: MILD LVH  Mitral: TRIVIAL MR  Tricuspid: TRIVIAL TR  __________  48-hour Holter monitor 03/2017 Gavin Potters): Summary:  1. Predominant rhythm was sinus rhythm with occasional multiform PVCs and PACs.  No prolonged pauses.  No sustained ventricular or supraventricular arrhythmias.     EKG:  EKG is not ordered today.    Recent Labs: 05/11/2022: ALT 17; BUN 22; Hemoglobin 11.0; Platelets 397; Potassium 3.9; Sodium 136 12/12/2022: Creatinine, Ser 1.70  Recent Lipid Panel No results found for: "CHOL", "TRIG", "HDL", "CHOLHDL", "VLDL", "LDLCALC", "LDLDIRECT"  PHYSICAL EXAM:    VS:  BP 137/77 (BP Location: Right Arm, Patient Position: Sitting, Cuff Size: Normal)   Pulse 86   Ht 5\' 5"  (1.651 m)   Wt 298 lb 3.2 oz (135.3 kg)   LMP 08/27/2015 (LMP Unknown) Comment: abnormal bleeding   SpO2 95%   BMI 49.62 kg/m   BMI: Body mass index is 49.62 kg/m.  Physical Exam Vitals reviewed.  Constitutional:      Appearance: She is well-developed.  HENT:     Head: Normocephalic and atraumatic.     Comments: Resolving ecchymosis along the right side of the face. Eyes:     General:  Right eye: No discharge.        Left eye: No discharge.  Neck:     Vascular: No JVD.  Cardiovascular:     Rate and Rhythm: Normal rate and regular rhythm.     Heart sounds: Normal heart sounds, S1 normal and S2 normal. Heart sounds not distant. No midsystolic click and no opening snap. No murmur heard.    No friction rub.  Pulmonary:     Effort: Pulmonary effort is normal. No respiratory distress.     Breath sounds: Normal breath sounds. No decreased breath sounds, wheezing or rales.  Chest:     Chest wall: No tenderness.  Abdominal:     General: There is no distension.     Palpations: Abdomen is soft.     Tenderness: There is no abdominal tenderness.  Musculoskeletal:     Cervical back: Normal range of motion.     Right lower leg: Edema present.     Left lower leg: Edema present.     Comments: Mild bilateral ankle edema.  Skin:    General: Skin is warm and dry.     Nails: There is no clubbing.  Neurological:     Mental Status: She is alert and oriented to person, place, and time.  Psychiatric:        Speech: Speech normal.        Behavior: Behavior normal.        Thought Content: Thought content normal.        Judgment: Judgment normal.     Wt Readings from Last 3 Encounters:  02/17/23 298 lb 3.2 oz (135.3 kg)  12/17/22 299 lb 8 oz (135.9 kg)  05/11/22 257 lb 15 oz (117 kg)     ASSESSMENT & PLAN:   Exertional dyspnea: Symptoms overall stable.  Recent Lexiscan MPI showed no evidence of ischemia.  Echo showed preserved LV systolic function with normal wall motion.  That said, cannot exclude the possibility of obstructive CAD.  At the same time, we cannot exclude some  degree of physical deconditioning in the context of obesity as well as exacerbation by palpitations.  In the setting of her underlying renal issues, we would have to be careful as if she is at increased risk of contrast-induced nephropathy and vascular access complications.  Would recommend escalation of medical therapy as indicated.  Palpitations: Place Zio patch with recommendation to escalate medical therapy pending results.  Would likely avoid calcium channel blocker in an effort to minimize lower extremity swelling.  Mechanical fall with head trauma: Stat head CT this afternoon showed no evidence of acute intracranial process.  No further headaches.  Cannot exclude postconcussive syndrome.  Follow-up with PCP.  Subclavian stenosis status post left subclavian artery stent placement: Most recent Doppler showed monophasic waveforms in the left arm suggestive of occluded stent in the left subclavian artery.  No flow limiting claudication, therefore no indication for revascularization at this time.  Blood pressure should be checked in the right arm.  HTN: Blood pressure reasonably controlled in the office today.  HLD with statin intolerance: LDL 111 in 11/2022.  Not on statins due to myalgia.  Will need to discuss alternative statin options and follow-up.  CKD with unilateral kidney with concomitant diabetes mellitus: Most recent renal function stable.  Follow-up with nephrology and PCP as directed.  Obesity: Now on Mounjaro.  Weight loss is encouraged through out of the diet and regular exercise, as tolerated.   Disposition: F/u with Dr. Kirke Corin or  an APP after Zio patch.   Medication Adjustments/Labs and Tests Ordered: Current medicines are reviewed at length with the patient today.  Concerns regarding medicines are outlined above. Medication changes, Labs and Tests ordered today are summarized above and listed in the Patient Instructions accessible in Encounters.   Signed, Eula Listen,  PA-C 02/17/2023 4:37 PM     [facility] -  [address] [place], [place] [address] [phone]

## 2023-02-17 NOTE — Patient Instructions (Signed)
Medication Instructions:  No changes at this time.   *If you need a refill on your cardiac medications before your next appointment, please call your pharmacy*   Lab Work: None  If you have labs (blood work) drawn today and your tests are completely normal, you will receive your results only by: MyChart Message (if you have MyChart) OR A paper copy in the mail If you have any lab test that is abnormal or we need to change your treatment, we will call you to review the results.   Testing/Procedures: Non-Cardiac CT scanning, (CAT scanning), is a noninvasive, special x-ray that produces cross-sectional images of the body using x-rays and a computer. CT scans help physicians diagnose and treat medical conditions. For some CT exams, a contrast material is used to enhance visibility in the area of the body being studied. CT scans provide greater clarity and reveal more details than regular x-ray exams.  Your physician has recommended that you wear a Zio monitor.   This monitor is a medical device that records the heart's electrical activity. Doctors most often use these monitors to diagnose arrhythmias. Arrhythmias are problems with the speed or rhythm of the heartbeat. The monitor is a small device applied to your chest. You can wear one while you do your normal daily activities. While wearing this monitor if you have any symptoms to push the button and record what you felt. Once you have worn this monitor for the period of time provider prescribed (Usually 14 days), you will return the monitor device in the postage paid box. Once it is returned they will download the data collected and provide [place] with a report which the provider will then review and we will call you with those results. Important tips:  Avoid showering during the first 24 hours of wearing the monitor. Avoid excessive sweating to help maximize wear time. Do not submerge the device, no hot tubs, and no swimming pools. Keep any  lotions or oils away from the patch. After 24 hours you may shower with the patch on. Take brief showers with your back facing the shower head.  Do not remove patch once it has been placed because that will interrupt data and decrease adhesive wear time. Push the button when you have any symptoms and write down what you were feeling. Once you have completed wearing your monitor, remove and place into box which has postage paid and place in your outgoing mailbox.  If for some reason you have misplaced your box then call our office and we can provide another box and/or mail it off for you.    Follow-Up: At [facility], you and your health needs are our priority.  As part of our continuing mission to provide you with exceptional heart care, we have created designated Provider Care Teams.  These Care Teams include your primary Cardiologist (physician) and Advanced Practice Providers (APPs -  Physician Assistants and Nurse Practitioners) who all work together to provide you with the care you need, when you need it.  Your next appointment:   8 week(s)  Provider:   Lorine Bears, MD or Eula Listen, PA-C

## 2023-02-19 DIAGNOSIS — R002 Palpitations: Secondary | ICD-10-CM | POA: Diagnosis not present

## 2023-04-15 NOTE — Progress Notes (Signed)
Cardiology Office Note    Date:  04/16/2023   ID:  [name], DOB 08/01/1961, MRN [id number]  PCP:  Alyssa Ferrier, MD  Cardiologist:  Alyssa Bears, MD  Electrophysiologist:  None   Chief Complaint: Follow-up  History of Present Illness:   Alyssa Maynard is a 62 y.o. 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   She was previously followed by Dr. Lady Maynard with [facility] cardiology.  48-hour Holter in 2018 showed a predominant rhythm of sinus with occasional multiform PVCs and PACs with no prolonged pauses or sustained arrhythmias.  Echo in 2018 showed an EF greater than 55% with mild LVH, normal RV systolic function, and trivial mitral regurgitation.  Lexiscan MPI in 2020 showed no evidence of ischemia or infarction with a normal EF.   She established with Alyssa Maynard in 12/2022 for evaluation of shortness of breath and near syncope.  She had recently undergone a Lexiscan MPI and 09/2022, ordered by PCP, which showed no significant perfusion defects.  Upon establishing with our office, she continued to note dyspnea with minimal activities that had been progressive to the point of happening after walking a few steps.  She was without frank chest pain.  She reported weight gain as well as multiple episodes of dizziness, flushing, and presyncope.  No tachypalpitations.  Echo in 02/2023 showed an EF of 60 to 65%, no regional wall motion abnormalities, borderline dilatation of the LV internal cavity, normal diastolic function parameters, normal RV systolic function with mildly enlarged ventricular cavity size, mildly dilated right atrium, and trivial mitral regurgitation.  She was last seen in the office in 02/2023 and reported a mechanical fall on 01/21/2023, striking the right side of her head/face on her  nightstand.  She denied LOC.  She did not seek medical attention at that time.  She reported palpitations and stable exertional dyspnea.  Head CT obtained that day showed no acute intracranial pathology.  Subsequent Zio patch demonstrated a predominant rhythm of sinus with an average rate of 79 bpm with a range of 57 to 164 bpm, 6 episodes of SVT with the fastest interval lasting 6 beats and the longest interval lasting 17 beats, and rare PACs/PVCs.  She comes in today doing well from a cardiac perspective, no symptoms of angina or cardiac decompensation.  Her shortness of breath is also improved.  No palpitations, dizziness, presyncope, or syncope.  No further falls.  She has noted some lower extremity swelling that is more progressive Today when she is sitting at her desk and improves with leg elevation.  Weight is down 4 pounds today when compared to last clinic visit.  Overall, she does not have any acute cardiac concerns at this time.   Labs independently reviewed: 02/2023 - Hgb 9.7, PLT 417, potassium 4.3, BUN 24, serum creatinine 1.4, albumin 3.8, AST/ALT normal, A1c 6.1, TC 168, TG 111, HDL 41, LDL 105 11/2022 - A1c 6.7, TSH normal  Past Medical History:  Diagnosis Date   Anxiety    Chronic kidney disease 04/2017   stage 3   Complication of anesthesia    pt experiences difficulty breathing after anesthesia due to sleep apnea and smoking   Depression    Diabetes mellitus without complication (HCC)    Dyspnea    GERD (gastroesophageal  reflux disease)    Hypertension    Renal insufficiency    Sleep apnea     Past Surgical History:  Procedure Laterality Date   APPENDECTOMY     CARDIAC SURGERY     CAROTID ANGIOGRAPHY N/A 04/27/2017   Procedure: Carotid Angiography;  Surgeon: Annice Needy, MD;  Location: [facility];  Service: Cardiovascular;  Laterality: N/A;   CHOLECYSTECTOMY     FOOT SURGERY Left    HAND SURGERY Right    HYSTEROSCOPY WITH D & C N/A 11/02/2015    Procedure: DILATATION AND CURETTAGE /HYSTEROSCOPY;  Surgeon: Ala Dach, MD;  Location: [facility] ORS;  Service: Gynecology;  Laterality: N/A;   HYSTEROSCOPY WITH D & C     LIVER SURGERY     ORIF TIBIA PLATEAU Right 07/05/2018   Procedure: OPEN REDUCTION INTERNAL FIXATION (ORIF) TIBIAL PLATEAU;  Surgeon: Lyndle Herrlich, MD;  Location: [facility] ORS;  Service: Orthopedics;  Laterality: Right;   TONSILLECTOMY     UPPER EXTREMITY ANGIOGRAPHY Left 09/08/2019   Procedure: UPPER EXTREMITY ANGIOGRAPHY;  Surgeon: Annice Needy, MD;  Location: [facility];  Service: Cardiovascular;  Laterality: Left;   UPPER EXTREMITY ANGIOGRAPHY N/A 09/17/2020   Procedure: UPPER EXTREMITY ANGIOGRAPHY;  Surgeon: Annice Needy, MD;  Location: [facility];  Service: Cardiovascular;  Laterality: N/A;   UPPER GI ENDOSCOPY      Current Medications: Current Meds  Medication Sig   allopurinol (ZYLOPRIM) 300 MG tablet Take 300 mg by mouth daily.   ASPIRIN 81 PO Take 81 mg by mouth daily.   cholecalciferol (VITAMIN D) 1000 units tablet Take 8,000 Units by mouth daily.   clopidogrel (PLAVIX) 75 MG tablet TAKE 1 TABLET(75 MG) BY MOUTH DAILY   cyanocobalamin (VITAMIN B12) 1000 MCG tablet Take 1,000 mcg by mouth daily.   cyanocobalamin (VITAMIN B12) 1000 MCG/ML injection Inject into the muscle.   esomeprazole (NEXIUM) 40 MG capsule Take 40 mg by mouth daily.   ezetimibe (ZETIA) 10 MG tablet Take 1 tablet (10 mg total) by mouth daily.   glimepiride (AMARYL) 4 MG tablet Take 4 mg by mouth daily.    hydroxychloroquine (PLAQUENIL) 200 MG tablet Take 1 tablet by mouth 2 (two) times daily.   losartan-hydrochlorothiazide (HYZAAR) 50-12.5 MG tablet Take 1 tablet by mouth daily.   MOUNJARO 5 MG/0.5ML Pen Inject 7.5 mg into the skin once a week.   pioglitazone (ACTOS) 30 MG tablet Take 1 tablet by mouth daily.   TRESIBA FLEXTOUCH 100 UNIT/ML FlexTouch Pen SMARTSIG:25 Unit(s) SUB-Q Every Night    Allergies:   Pravastatin,  Latex, Levofloxacin, Rosuvastatin, and Clopidogrel   Social History   Socioeconomic History   Marital status: Married    Spouse name: Not on file   Number of children: Not on file   Years of education: Not on file   Highest education level: Not on file  Occupational History   Not on file  Tobacco Use   Smoking status: Former    Packs/day: 0.50    Years: 30.00    Additional pack years: 0.00    Total pack years: 15.00    Types: Cigarettes    Quit date: 11/04/2021    Years since quitting: 1.4   Smokeless tobacco: Never  Vaping Use   Vaping Use: Never used  Substance and Sexual Activity   Alcohol use: Not Currently   Drug use: No   Sexual activity: Not on file  Other Topics Concern   Not  on file  Social History Narrative   Not on file   Social Determinants of Health   Financial Resource Strain: Not on file  Food Insecurity: Not on file  Transportation Needs: Not on file  Physical Activity: Not on file  Stress: Not on file  Social Connections: Not on file     Family History:  The patient's family history includes Cancer in her father; Heart attack in her father and mother; Stroke in her mother. There is no history of Breast cancer.  ROS:   12-point review of systems is negative unless otherwise noted in the HPI.   EKGs/Labs/Other Studies Reviewed:    Studies reviewed were summarized above. The additional studies were reviewed today:  Zio patch 02/2023: Patient had a min HR of 57 bpm, max HR of 164 bpm, and avg HR of 79 bpm. Predominant underlying rhythm was Sinus Rhythm.  6 Supraventricular Tachycardia runs occurred, the run with the fastest interval lasting 6 beats with a max rate of 164 bpm, the longest lasting 17 beats with an avg rate of 111 bpm.  Rare PACs and rare PVCs. __________  2D echo 02/03/2023: 1. Left ventricular ejection fraction, by estimation, is 60 to 65%. The  left ventricle has normal function. The left ventricle has no regional  wall motion  abnormalities. The left ventricular internal cavity size was  borderline dilated. Left ventricular  diastolic parameters were normal. The average left ventricular global  longitudinal strain is -21.4 %. The global longitudinal strain is normal.   2. Right ventricular systolic function is normal. The right ventricular  size is mildly enlarged. Mildly increased right ventricular wall  thickness. Tricuspid regurgitation signal is inadequate for assessing PA  pressure.   3. Right atrial size was mildly dilated.   4. The mitral valve is grossly normal. Trivial mitral valve  regurgitation. No evidence of mitral stenosis.   5. The aortic valve was not well visualized. Aortic valve regurgitation  is not visualized. No aortic stenosis is present.  __________   Eugenie Birks MPI 09/19/2022:   The study is normal. The study is low risk.   Left ventricular function is normal. End diastolic cavity size is normal.   Normal myocardial perfusion scan no evidence of stress-induced myocardial ischemia with chemical protocol normal left ventricular function 55%.  This is a low risk study. __________   Eugenie Birks MPI 08/31/2019 Gavin Potters): Summary of LV Function: Normal    TID Ratio: 1.05   LVEF= 77%   FINDINGS:  Regional wall motion:  reveals normal myocardial thickening and wall  motion.  The overall quality of the study is good.   Artifacts noted: no  Left ventricular cavity: normal.   Perfusion Analysis:  SPECT images demonstrate homogeneous tracer  distribution throughout the myocardium. Defect type : Normal  __________   2D echo 03/13/2017 Gavin Potters): NTERPRETATION  NORMAL LEFT VENTRICULAR SYSTOLIC FUNCTION   WITH MILD LVH  NORMAL RIGHT VENTRICULAR SYSTOLIC FUNCTION  TRIVIAL REGURGITATION NOTED (See above)  NO VALVULAR STENOSIS  EF >55%  Closest EF: >55% (Estimated)  LVH: MILD LVH  Mitral: TRIVIAL MR  Tricuspid: TRIVIAL TR  __________   48-hour Holter monitor 03/2017  Gavin Potters): Summary:  1. Predominant rhythm was sinus rhythm with occasional multiform PVCs and PACs.  No prolonged pauses.  No sustained ventricular or supraventricular arrhythmias.    EKG:  EKG is not ordered today.    Recent Labs: 05/11/2022: ALT 17; BUN 22; Hemoglobin 11.0; Platelets 397; Potassium 3.9; Sodium 136 12/12/2022: Creatinine, Ser  1.70  Recent Lipid Panel No results found for: "CHOL", "TRIG", "HDL", "CHOLHDL", "VLDL", "LDLCALC", "LDLDIRECT"  PHYSICAL EXAM:    VS:  BP 116/72 (BP Location: Right Wrist, Patient Position: Sitting, Cuff Size: Large)   Pulse 83   Ht 5\' 5"  (1.651 m)   Wt 294 lb 6 oz (133.5 kg)   LMP 08/27/2015 (LMP Unknown) Comment: abnormal bleeding  SpO2 96%   BMI 48.99 kg/m   BMI: Body mass index is 48.99 kg/m.  Physical Exam Vitals reviewed.  Constitutional:      Appearance: She is well-developed.  HENT:     Head: Normocephalic and atraumatic.  Eyes:     General:        Right eye: No discharge.        Left eye: No discharge.  Neck:     Vascular: No JVD.  Cardiovascular:     Rate and Rhythm: Normal rate and regular rhythm.     Heart sounds: Normal heart sounds, S1 normal and S2 normal. Heart sounds not distant. No midsystolic click and no opening snap. No murmur heard.    No friction rub.  Pulmonary:     Effort: Pulmonary effort is normal. No respiratory distress.     Breath sounds: Normal breath sounds. No decreased breath sounds, wheezing or rales.  Chest:     Chest wall: No tenderness.  Abdominal:     General: There is no distension.  Musculoskeletal:     Cervical back: Normal range of motion.     Comments: Mild bilateral ankle edema  Skin:    General: Skin is warm and dry.     Nails: There is no clubbing.  Neurological:     Mental Status: She is alert and oriented to person, place, and time.  Psychiatric:        Speech: Speech normal.        Behavior: Behavior normal.        Thought Content: Thought content normal.         Judgment: Judgment normal.     Wt Readings from Last 3 Encounters:  04/16/23 294 lb 6 oz (133.5 kg)  02/17/23 298 lb 3.2 oz (135.3 kg)  12/17/22 299 lb 8 oz (135.9 kg)     ASSESSMENT & PLAN:   Exertional dyspnea: Symptoms overall improved.  Recent Lexiscan MPI showed no evidence of ischemia.  Echo showed preserved LV systolic function with normal wall motion.  Cannot fully exclude the possibility of obstructive CAD with noted coronary artery calcification on CT attenuation corrected images of Lexiscan.  However, cannot also exclude some degree of physical deconditioning in the context of obesity.  Given underlying renal issues with unilateral kidney, and in the context of improved symptoms, further testing is deferred at this time.  Should she have return of symptoms ischemic testing will need to be revisited.  Palpitations: Recent Zio patch showed 6 episodes of SVT with the longest interval lasting 17 beats.  Quiescent.  Stenosis status post left subclavian artery stent placement: Most recent Doppler showed monophasic waveforms in the left arm suggestive of occluded stent in the left subclavian artery. No flow limiting claudication, therefore no indication for revascularization at this time. Blood pressure should be checked in the right arm.  She remains on aspirin and clopidogrel under the direction of vascular surgery.  HTN: Blood pressure is well-controlled in the office today.  She remains on losartan.  HLD with statin intolerance: LDL 105 in 02/2023.  Not on statin secondary to  myalgia.  Has previously not tolerated rosuvastatin 5 mg, Lipitor 40 mg and 10 mg, or simvastatin 10 and 20 mg.  Trial of ezetimibe 10 mg daily.  Follow-up fasting lipid panel and LFT in 3 months.  CKD with unilateral kidney with concomitant diabetes mellitus: Most recent renal function stable.  Avoid nephrotoxic agents.  Follow-up with nephrology and PCP as directed.  Obesity: Her weight is down 4 pounds today  when compared to her visit in 02/2023.  Continued weight loss is encouraged through heart healthy diet and regular exercise.   Disposition: F/u with Alyssa Maynard or an APP in 6 months.   Medication Adjustments/Labs and Tests Ordered: Current medicines are reviewed at length with the patient today.  Concerns regarding medicines are outlined above. Medication changes, Labs and Tests ordered today are summarized above and listed in the Patient Instructions accessible in Encounters.   Signed, Eula Listen, PA-C 04/16/2023 3:35 PM     [facility] [place] [address] [place], [place] [address] [phone]

## 2023-04-16 ENCOUNTER — Ambulatory Visit: Payer: 59 | Attending: Physician Assistant | Admitting: Physician Assistant

## 2023-04-16 ENCOUNTER — Encounter: Payer: Self-pay | Admitting: Physician Assistant

## 2023-04-16 VITALS — BP 116/72 | HR 83 | Ht 65.0 in | Wt 294.4 lb

## 2023-04-16 DIAGNOSIS — E785 Hyperlipidemia, unspecified: Secondary | ICD-10-CM

## 2023-04-16 DIAGNOSIS — N289 Disorder of kidney and ureter, unspecified: Secondary | ICD-10-CM

## 2023-04-16 DIAGNOSIS — R002 Palpitations: Secondary | ICD-10-CM

## 2023-04-16 DIAGNOSIS — I10 Essential (primary) hypertension: Secondary | ICD-10-CM | POA: Diagnosis not present

## 2023-04-16 DIAGNOSIS — I771 Stricture of artery: Secondary | ICD-10-CM

## 2023-04-16 DIAGNOSIS — R0609 Other forms of dyspnea: Secondary | ICD-10-CM | POA: Diagnosis not present

## 2023-04-16 DIAGNOSIS — Z789 Other specified health status: Secondary | ICD-10-CM

## 2023-04-16 MED ORDER — EZETIMIBE 10 MG PO TABS: 10.0000 mg | ORAL_TABLET | Freq: Every day | ORAL | 3 refills | Status: DC

## 2023-04-16 NOTE — Patient Instructions (Signed)
Medication Instructions:  Your physician has recommended you make the following change in your medication:   START Ezetimibe 10 mg once daily  *If you need a refill on your cardiac medications before your next appointment, please call your pharmacy*   Lab Work: Lipid & Liver panel in 3 months. No appointment is needed. Go to the [facility] entrance and check in at registration desk. These are fasting labs so nothing to eat or drink after midnight except sip of water with your medications.   If you have labs (blood work) drawn today and your tests are completely normal, you will receive your results only by: MyChart Message (if you have MyChart) OR A paper copy in the mail If you have any lab test that is abnormal or we need to change your treatment, we will call you to review the results.   Testing/Procedures: None   Follow-Up: At [facility], you and your health needs are our priority.  As part of our continuing mission to provide you with exceptional heart care, we have created designated Provider Care Teams.  These Care Teams include your primary Cardiologist (physician) and Advanced Practice Providers (APPs -  Physician Assistants and Nurse Practitioners) who all work together to provide you with the care you need, when you need it.   Your next appointment:   6 month(s)  Provider:   Lorine Bears, MD or Eula Listen, PA-C

## 2023-05-20 ENCOUNTER — Telehealth: Payer: Self-pay | Admitting: Cardiovascular Disease

## 2023-05-20 NOTE — Telephone Encounter (Signed)
Called patient, advised that she has never been able to tolerate medications for cholesterol, and she wanted to make PA aware that she stopped the Zetia as well, it gave her muscle aches and she stated it was not worth it for her to be hurting all the time. She also advised she was suppose to repeat blood work, but without the medication she states she was not sure if this was still needed.  Will route to PA. Thanks!

## 2023-05-20 NOTE — Telephone Encounter (Signed)
Pt c/o medication issue:  1. Name of Medication: ezetimibe (ZETIA) 10 MG tablet   2. How are you currently taking this medication (dosage and times per day)? Stopped taking a couple of weeks ago.   3. Are you having a reaction (difficulty breathing--STAT)?   4. What is your medication issue? Was told to try this medication and it did not seem to work per patient. She states no urgency, but did want to inform her cardiologist.

## 2023-05-20 NOTE — Telephone Encounter (Signed)
Thank you for the update.  Okay to discontinue ezetimibe secondary to myalgias.  No need for repeat blood work at this time.  Options can be revisited when she follows up later this year.

## 2023-05-25 NOTE — Telephone Encounter (Signed)
Reviewed provider recommendations and medication discontinued. She verbalized understanding with no further questions at this time.

## 2023-09-09 ENCOUNTER — Other Ambulatory Visit: Payer: Self-pay | Admitting: Internal Medicine

## 2023-09-09 DIAGNOSIS — I10 Essential (primary) hypertension: Secondary | ICD-10-CM

## 2023-09-09 DIAGNOSIS — R911 Solitary pulmonary nodule: Secondary | ICD-10-CM

## 2023-09-15 ENCOUNTER — Ambulatory Visit
Admission: RE | Admit: 2023-09-15 | Discharge: 2023-09-15 | Disposition: A | Discharge status: Home / Self Care | Payer: 59 | Source: Ambulatory Visit | Attending: Internal Medicine | Admitting: Internal Medicine

## 2023-09-15 DIAGNOSIS — I10 Essential (primary) hypertension: Secondary | ICD-10-CM | POA: Insufficient documentation

## 2023-09-15 DIAGNOSIS — R911 Solitary pulmonary nodule: Secondary | ICD-10-CM | POA: Diagnosis present

## 2023-12-04 ENCOUNTER — Other Ambulatory Visit: Payer: Self-pay | Admitting: Internal Medicine

## 2023-12-04 DIAGNOSIS — Z1231 Encounter for screening mammogram for malignant neoplasm of breast: Secondary | ICD-10-CM

## 2023-12-09 ENCOUNTER — Ambulatory Visit
Admission: RE | Admit: 2023-12-09 | Discharge: 2023-12-09 | Disposition: A | Discharge status: Home / Self Care | Payer: 59 | Source: Ambulatory Visit | Attending: Internal Medicine | Admitting: Internal Medicine

## 2023-12-09 DIAGNOSIS — Z1231 Encounter for screening mammogram for malignant neoplasm of breast: Secondary | ICD-10-CM | POA: Insufficient documentation

## 2024-03-11 DIAGNOSIS — M62830 Muscle spasm of back: Secondary | ICD-10-CM | POA: Diagnosis not present

## 2024-03-11 DIAGNOSIS — M9902 Segmental and somatic dysfunction of thoracic region: Secondary | ICD-10-CM | POA: Diagnosis not present

## 2024-03-11 DIAGNOSIS — M9901 Segmental and somatic dysfunction of cervical region: Secondary | ICD-10-CM | POA: Diagnosis not present

## 2024-03-11 DIAGNOSIS — M5414 Radiculopathy, thoracic region: Secondary | ICD-10-CM | POA: Diagnosis not present

## 2024-03-15 DIAGNOSIS — M5414 Radiculopathy, thoracic region: Secondary | ICD-10-CM | POA: Diagnosis not present

## 2024-03-15 DIAGNOSIS — M9902 Segmental and somatic dysfunction of thoracic region: Secondary | ICD-10-CM | POA: Diagnosis not present

## 2024-03-15 DIAGNOSIS — M9901 Segmental and somatic dysfunction of cervical region: Secondary | ICD-10-CM | POA: Diagnosis not present

## 2024-03-15 DIAGNOSIS — M62830 Muscle spasm of back: Secondary | ICD-10-CM | POA: Diagnosis not present

## 2024-03-21 DIAGNOSIS — M5414 Radiculopathy, thoracic region: Secondary | ICD-10-CM | POA: Diagnosis not present

## 2024-03-21 DIAGNOSIS — M62830 Muscle spasm of back: Secondary | ICD-10-CM | POA: Diagnosis not present

## 2024-03-21 DIAGNOSIS — M9901 Segmental and somatic dysfunction of cervical region: Secondary | ICD-10-CM | POA: Diagnosis not present

## 2024-03-21 DIAGNOSIS — M9902 Segmental and somatic dysfunction of thoracic region: Secondary | ICD-10-CM | POA: Diagnosis not present

## 2024-03-29 DIAGNOSIS — M5414 Radiculopathy, thoracic region: Secondary | ICD-10-CM | POA: Diagnosis not present

## 2024-03-29 DIAGNOSIS — M9901 Segmental and somatic dysfunction of cervical region: Secondary | ICD-10-CM | POA: Diagnosis not present

## 2024-03-29 DIAGNOSIS — M9902 Segmental and somatic dysfunction of thoracic region: Secondary | ICD-10-CM | POA: Diagnosis not present

## 2024-03-29 DIAGNOSIS — M62830 Muscle spasm of back: Secondary | ICD-10-CM | POA: Diagnosis not present

## 2024-05-26 DIAGNOSIS — E785 Hyperlipidemia, unspecified: Secondary | ICD-10-CM | POA: Diagnosis not present

## 2024-05-26 DIAGNOSIS — E538 Deficiency of other specified B group vitamins: Secondary | ICD-10-CM | POA: Diagnosis not present

## 2024-05-26 DIAGNOSIS — N183 Chronic kidney disease, stage 3 unspecified: Secondary | ICD-10-CM | POA: Diagnosis not present

## 2024-05-26 DIAGNOSIS — E1122 Type 2 diabetes mellitus with diabetic chronic kidney disease: Secondary | ICD-10-CM | POA: Diagnosis not present

## 2024-05-26 DIAGNOSIS — M1A3790 Chronic gout due to renal impairment, unspecified ankle and foot, without tophus (tophi): Secondary | ICD-10-CM | POA: Diagnosis not present

## 2024-05-26 DIAGNOSIS — I10 Essential (primary) hypertension: Secondary | ICD-10-CM | POA: Diagnosis not present

## 2024-06-02 DIAGNOSIS — I2510 Atherosclerotic heart disease of native coronary artery without angina pectoris: Secondary | ICD-10-CM | POA: Diagnosis not present

## 2024-06-02 DIAGNOSIS — E538 Deficiency of other specified B group vitamins: Secondary | ICD-10-CM | POA: Diagnosis not present

## 2024-06-02 DIAGNOSIS — Z6841 Body Mass Index (BMI) 40.0 and over, adult: Secondary | ICD-10-CM | POA: Diagnosis not present

## 2024-06-02 DIAGNOSIS — N1832 Chronic kidney disease, stage 3b: Secondary | ICD-10-CM | POA: Diagnosis not present

## 2024-06-02 DIAGNOSIS — M1A3790 Chronic gout due to renal impairment, unspecified ankle and foot, without tophus (tophi): Secondary | ICD-10-CM | POA: Diagnosis not present

## 2024-06-02 DIAGNOSIS — F17200 Nicotine dependence, unspecified, uncomplicated: Secondary | ICD-10-CM | POA: Diagnosis not present

## 2024-06-02 DIAGNOSIS — G4733 Obstructive sleep apnea (adult) (pediatric): Secondary | ICD-10-CM | POA: Diagnosis not present

## 2024-06-02 DIAGNOSIS — N2581 Secondary hyperparathyroidism of renal origin: Secondary | ICD-10-CM | POA: Diagnosis not present

## 2024-06-02 DIAGNOSIS — I10 Essential (primary) hypertension: Secondary | ICD-10-CM | POA: Diagnosis not present

## 2024-06-02 DIAGNOSIS — I771 Stricture of artery: Secondary | ICD-10-CM | POA: Diagnosis not present

## 2024-06-02 DIAGNOSIS — E1122 Type 2 diabetes mellitus with diabetic chronic kidney disease: Secondary | ICD-10-CM | POA: Diagnosis not present

## 2024-06-02 DIAGNOSIS — M5416 Radiculopathy, lumbar region: Secondary | ICD-10-CM | POA: Diagnosis not present

## 2024-06-06 DIAGNOSIS — Z780 Asymptomatic menopausal state: Secondary | ICD-10-CM | POA: Diagnosis not present

## 2024-08-01 DIAGNOSIS — M62830 Muscle spasm of back: Secondary | ICD-10-CM | POA: Diagnosis not present

## 2024-08-01 DIAGNOSIS — M9902 Segmental and somatic dysfunction of thoracic region: Secondary | ICD-10-CM | POA: Diagnosis not present

## 2024-08-01 DIAGNOSIS — M9901 Segmental and somatic dysfunction of cervical region: Secondary | ICD-10-CM | POA: Diagnosis not present

## 2024-08-01 DIAGNOSIS — M5414 Radiculopathy, thoracic region: Secondary | ICD-10-CM | POA: Diagnosis not present

## 2024-08-25 DIAGNOSIS — M9901 Segmental and somatic dysfunction of cervical region: Secondary | ICD-10-CM | POA: Diagnosis not present

## 2024-08-25 DIAGNOSIS — M62830 Muscle spasm of back: Secondary | ICD-10-CM | POA: Diagnosis not present

## 2024-08-25 DIAGNOSIS — M5414 Radiculopathy, thoracic region: Secondary | ICD-10-CM | POA: Diagnosis not present

## 2024-08-25 DIAGNOSIS — M9902 Segmental and somatic dysfunction of thoracic region: Secondary | ICD-10-CM | POA: Diagnosis not present

## 2024-08-30 DIAGNOSIS — E1122 Type 2 diabetes mellitus with diabetic chronic kidney disease: Secondary | ICD-10-CM | POA: Diagnosis not present

## 2024-08-30 DIAGNOSIS — N183 Chronic kidney disease, stage 3 unspecified: Secondary | ICD-10-CM | POA: Diagnosis not present

## 2024-09-20 DIAGNOSIS — M9902 Segmental and somatic dysfunction of thoracic region: Secondary | ICD-10-CM | POA: Diagnosis not present

## 2024-09-20 DIAGNOSIS — M9901 Segmental and somatic dysfunction of cervical region: Secondary | ICD-10-CM | POA: Diagnosis not present

## 2024-09-20 DIAGNOSIS — M5414 Radiculopathy, thoracic region: Secondary | ICD-10-CM | POA: Diagnosis not present

## 2024-09-20 DIAGNOSIS — M62830 Muscle spasm of back: Secondary | ICD-10-CM | POA: Diagnosis not present

## 2024-12-05 ENCOUNTER — Other Ambulatory Visit: Payer: Self-pay | Admitting: Internal Medicine

## 2024-12-05 DIAGNOSIS — R911 Solitary pulmonary nodule: Secondary | ICD-10-CM

## 2024-12-05 DIAGNOSIS — I10 Essential (primary) hypertension: Secondary | ICD-10-CM

## 2024-12-06 ENCOUNTER — Ambulatory Visit: Admission: RE | Admit: 2024-12-06 | Discharge: 2024-12-06 | Attending: Internal Medicine | Admitting: Internal Medicine

## 2024-12-06 DIAGNOSIS — R911 Solitary pulmonary nodule: Secondary | ICD-10-CM

## 2024-12-06 DIAGNOSIS — I10 Essential (primary) hypertension: Secondary | ICD-10-CM
